# Patient Record
Sex: Female | Born: 1990 | Race: White | Hispanic: No | Marital: Married | Smoking: Former smoker
Health system: Southern US, Community
[De-identification: ages and names within clinical notes are randomized; demographics above are authoritative.]

## PROBLEM LIST (undated history)

## (undated) DIAGNOSIS — N926 Irregular menstruation, unspecified: Secondary | ICD-10-CM

## (undated) DIAGNOSIS — J45909 Unspecified asthma, uncomplicated: Secondary | ICD-10-CM

## (undated) DIAGNOSIS — N941 Unspecified dyspareunia: Secondary | ICD-10-CM

## (undated) DIAGNOSIS — A749 Chlamydial infection, unspecified: Secondary | ICD-10-CM

## (undated) DIAGNOSIS — J302 Other seasonal allergic rhinitis: Secondary | ICD-10-CM

## (undated) DIAGNOSIS — Z8759 Personal history of other complications of pregnancy, childbirth and the puerperium: Secondary | ICD-10-CM

## (undated) DIAGNOSIS — O99345 Other mental disorders complicating the puerperium: Secondary | ICD-10-CM

## (undated) DIAGNOSIS — F53 Postpartum depression: Secondary | ICD-10-CM

## (undated) DIAGNOSIS — G43909 Migraine, unspecified, not intractable, without status migrainosus: Secondary | ICD-10-CM

## (undated) HISTORY — DX: Personal history of other complications of pregnancy, childbirth and the puerperium: Z87.59

## (undated) HISTORY — DX: Unspecified dyspareunia: N94.10

## (undated) HISTORY — DX: Irregular menstruation, unspecified: N92.6

## (undated) HISTORY — DX: Other seasonal allergic rhinitis: J30.2

## (undated) HISTORY — DX: Postpartum depression: F53.0

## (undated) HISTORY — DX: Migraine, unspecified, not intractable, without status migrainosus: G43.909

## (undated) HISTORY — DX: Unspecified asthma, uncomplicated: J45.909

## (undated) HISTORY — DX: Other mental disorders complicating the puerperium: O99.345

## (undated) HISTORY — PX: IUD REMOVAL: SHX5392

## (undated) HISTORY — DX: Chlamydial infection, unspecified: A74.9

---
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
## 2010-06-02 HISTORY — PX: INTRAUTERINE DEVICE (IUD) INSERTION: SHX5877

## 2013-04-26 ENCOUNTER — Inpatient Hospital Stay: Payer: Self-pay | Admitting: Obstetrics & Gynecology

## 2013-04-26 LAB — CBC WITH DIFFERENTIAL/PLATELET
Basophil #: 0.1 10*3/uL (ref 0.0–0.1)
Basophil %: 0.5 %
Eosinophil #: 0.1 10*3/uL (ref 0.0–0.7)
Lymphocyte #: 1.5 10*3/uL (ref 1.0–3.6)
Lymphocyte %: 9.9 %
MCH: 29.3 pg (ref 26.0–34.0)
MCV: 85 fL (ref 80–100)
Neutrophil %: 82.5 %
Platelet: 171 10*3/uL (ref 150–440)
RBC: 4.19 10*6/uL (ref 3.80–5.20)
RDW: 12.8 % (ref 11.5–14.5)

## 2013-04-27 LAB — GC/CHLAMYDIA PROBE AMP

## 2013-04-27 LAB — HEMATOCRIT: HCT: 32.5 % — ABNORMAL LOW (ref 35.0–47.0)

## 2014-03-07 ENCOUNTER — Emergency Department: Payer: Self-pay | Admitting: Emergency Medicine

## 2014-03-07 LAB — BASIC METABOLIC PANEL
Anion Gap: 4 — ABNORMAL LOW (ref 7–16)
BUN: 14 mg/dL (ref 7–18)
CHLORIDE: 105 mmol/L (ref 98–107)
CO2: 29 mmol/L (ref 21–32)
Calcium, Total: 8.9 mg/dL (ref 8.5–10.1)
Creatinine: 0.67 mg/dL (ref 0.60–1.30)
EGFR (Non-African Amer.): >60
GLUCOSE: 93 mg/dL (ref 65–99)
OSMOLALITY: 276 (ref 275–301)
Potassium: 4 mmol/L (ref 3.5–5.1)
Sodium: 138 mmol/L (ref 136–145)

## 2014-03-07 LAB — HEPATIC FUNCTION PANEL A (ARMC)
ALBUMIN: 4.3 g/dL (ref 3.4–5.0)
Alkaline Phosphatase: 70 U/L
Bilirubin,Total: 0.2 mg/dL (ref 0.2–1.0)
SGOT(AST): 23 U/L (ref 15–37)
SGPT (ALT): 16 U/L (ref 12–78)
TOTAL PROTEIN: 7.4 g/dL (ref 6.4–8.2)

## 2014-03-07 LAB — CBC
HCT: 40.3 % (ref 35.0–47.0)
HGB: 13.9 g/dL (ref 12.0–16.0)
MCH: 30.1 pg (ref 26.0–34.0)
MCHC: 34.5 g/dL (ref 32.0–36.0)
MCV: 87 fL (ref 80–100)
PLATELETS: 288 10*3/uL (ref 150–440)
RBC: 4.62 10*6/uL (ref 3.80–5.20)
RDW: 13.4 % (ref 11.5–14.5)
WBC: 6.8 10*3/uL (ref 3.6–11.0)

## 2014-03-07 LAB — LIPASE, BLOOD: LIPASE: 197 U/L (ref 73–393)

## 2015-03-26 NOTE — H&P (Signed)
L&D Evaluation:  History:  HPI 24 yo G2P1001 at 633w4d by D=7wk US derived EDC of 05/13/13 presenting with contractions.  The patient was 2cm at 36 weeks and at her prenatal visit today was noted to be 3/70/-1.  No LOF, no VB.  [facility] for echogenic bowl noted at 20 week anatomy US with resolution on follow up US at 25 weeks.  First trimester screening was obtained with T21 risk of <1:10000 and T:18 risk of <1:10000.  CF carrier screening negative.  Remainder of PNL B pos / ABSC neg / RI / VZNI / HIV neg / HBsAg neg / RPR NR / GC & CT neg & neg / 1-hr OGTT 69 / GBS neg 04/18/13   Presents with contractions   Patient's Medical History Asthma  migraines   Patient's Surgical History none   Medications Pre Natal Vitamins   Allergies PCN, ASA   Social History none   Family History Non-Contributory   ROS:  ROS All systems were reviewed.  HEENT, CNS, GI, GU, Respiratory, CV, Renal and Musculoskeletal systems were found to be normal.   Exam:  Vital Signs stable   Urine Protein not completed   General no apparent distress   Mental Status clear   Abdomen gravid, tender with contractions   Estimated Fetal Weight Average for gestational age   Fetal Position vtx   Edema no edema   Pelvic no external lesions, 7cm per nursing staff   Mebranes Intact   FHT 120, moderate, + accels, no decels   Ucx regular   Impression:  Impression active labor   Plan:  Plan EFM/NST, monitor contractions and for cervical change   Electronic Signatures: Lorrene ReidStaebler, Dehlia Kilner M (MD)  (Signed 11-Jun-14 19:18)  Authored: L&D Evaluation   Last Updated: 11-Jun-14 19:18 by Lorrene ReidStaebler, Geddy Boydstun M (MD)

## 2016-01-23 LAB — HM PAP SMEAR

## 2016-09-18 LAB — OB RESULTS CONSOLE HEPATITIS B SURFACE ANTIGEN: Hepatitis B Surface Ag: NEGATIVE

## 2016-09-18 LAB — OB RESULTS CONSOLE HGB/HCT, BLOOD
HEMATOCRIT: 37 %
HEMOGLOBIN: 12.7 g/dL

## 2016-09-18 LAB — OB RESULTS CONSOLE HIV ANTIBODY (ROUTINE TESTING): HIV: NONREACTIVE

## 2016-09-18 LAB — OB RESULTS CONSOLE ABO/RH: RH Type: POSITIVE

## 2016-09-18 LAB — OB RESULTS CONSOLE GC/CHLAMYDIA
Chlamydia: NEGATIVE
Gonorrhea: NEGATIVE

## 2016-09-18 LAB — OB RESULTS CONSOLE RPR: RPR: NONREACTIVE

## 2016-09-18 LAB — OB RESULTS CONSOLE PLATELET COUNT: PLATELETS: 280 10*3/uL

## 2016-09-18 LAB — OB RESULTS CONSOLE RUBELLA ANTIBODY, IGM: RUBELLA: IMMUNE

## 2016-09-18 LAB — OB RESULTS CONSOLE ANTIBODY SCREEN: Antibody Screen: NEGATIVE

## 2016-09-18 LAB — OB RESULTS CONSOLE VARICELLA ZOSTER ANTIBODY, IGG: Varicella: NON-IMMUNE/NOT IMMUNE

## 2016-11-16 NOTE — L&D Delivery Note (Signed)
Date of delivery: 05/04/2017 Estimated Date of Delivery: 05/08/17 EGA: 6671w3d  Delivery Note At 7:59 PM a viable female was delivered via Vaginal, Spontaneous Delivery (Presentation: OA; ROA).  APGAR: 6, 8; weight 8 lb 7.5 oz (3840 g).   Placenta status: spontaneous, intact.  Cord:  with the following complications: none.  Cord pH: NA  Called to see patient for decision to AROM bulging bag of fluid. AROM for large amount of light meconium stained fluid. Following AROM small lip of anterior cervix and patient easily pushed past cervix following a short rest time. She pushed for 35 minutes to deliver a viable female infant.  The head followed by shoulders, which delivered without difficulty, and the rest of the body.  No nuchal cord noted.  Baby to mom's chest.  Cord clamped and cut after < 1 min delay and baby taken to warmer for evaluation and transition support.  No cord blood obtained.  Placenta delivered spontaneously, intact, with a 3-vessel cord.  Perineum intact.  All counts correct.  Hemostasis obtained with IV pitocin and fundal massage.   Anesthesia: none Episiotomy: none Lacerations: none  Suture Repair: NA Est. Blood Loss (mL): 250   Mom to postpartum.  Baby initially to special care nursery for transition.  Tresea MallJane Jett Kulzer, CNM 05/04/2017, 8:36 PM

## 2017-01-22 ENCOUNTER — Encounter: Payer: Self-pay | Admitting: Certified Nurse Midwife

## 2017-01-22 ENCOUNTER — Ambulatory Visit (INDEPENDENT_AMBULATORY_CARE_PROVIDER_SITE_OTHER): Payer: Self-pay | Admitting: Certified Nurse Midwife

## 2017-01-22 VITALS — BP 102/62 | HR 94 | Wt 158.0 lb

## 2017-01-22 DIAGNOSIS — Z139 Encounter for screening, unspecified: Secondary | ICD-10-CM

## 2017-01-22 DIAGNOSIS — Z131 Encounter for screening for diabetes mellitus: Secondary | ICD-10-CM

## 2017-01-22 DIAGNOSIS — F329 Major depressive disorder, single episode, unspecified: Secondary | ICD-10-CM

## 2017-01-22 DIAGNOSIS — Z3A24 24 weeks gestation of pregnancy: Secondary | ICD-10-CM

## 2017-01-22 DIAGNOSIS — O0990 Supervision of high risk pregnancy, unspecified, unspecified trimester: Secondary | ICD-10-CM | POA: Insufficient documentation

## 2017-01-22 DIAGNOSIS — O99342 Other mental disorders complicating pregnancy, second trimester: Secondary | ICD-10-CM

## 2017-01-22 DIAGNOSIS — Z113 Encounter for screening for infections with a predominantly sexual mode of transmission: Secondary | ICD-10-CM

## 2017-01-22 DIAGNOSIS — F32A Depression, unspecified: Secondary | ICD-10-CM | POA: Insufficient documentation

## 2017-01-22 DIAGNOSIS — O0992 Supervision of high risk pregnancy, unspecified, second trimester: Secondary | ICD-10-CM

## 2017-01-22 MED ORDER — SERTRALINE HCL 50 MG PO TABS: ORAL_TABLET | ORAL | 2 refills | Status: DC

## 2017-01-22 NOTE — Progress Notes (Signed)
Complains of some sacral/left SI joint back pain. Has had intermittently since last delivery, returning as pregnancy progresses. DIscussed using back support at work and using good Estate manager/land agentbody mechanics. Can use Biofreeze/ ice or heat to the area. Feels that Zoloft has helped, but thinks needs higher dose. Will increase dose from 50 mgm to 75 mgm. Wants to breast feed. Info on sibling classes given. Interested in N2O2 for pain relief in labor. Wants to avoid epidural.28 week labs and ROB in 3-4 weeks.

## 2017-01-22 NOTE — Progress Notes (Signed)
Pt c/o recent nose bleeds

## 2017-02-19 ENCOUNTER — Other Ambulatory Visit: Payer: Self-pay

## 2017-02-19 ENCOUNTER — Encounter: Payer: Self-pay | Admitting: Certified Nurse Midwife

## 2017-02-22 NOTE — Addendum Note (Signed)
Addended by: Kathlene Cote on: 02/22/2017 09:18 AM   Modules accepted: Orders

## 2017-02-23 ENCOUNTER — Other Ambulatory Visit: Payer: 59

## 2017-02-23 ENCOUNTER — Telehealth: Payer: Self-pay

## 2017-02-23 ENCOUNTER — Ambulatory Visit (INDEPENDENT_AMBULATORY_CARE_PROVIDER_SITE_OTHER): Payer: 59 | Admitting: Obstetrics & Gynecology

## 2017-02-23 VITALS — BP 100/60 | HR 111 | Wt 169.0 lb

## 2017-02-23 DIAGNOSIS — O0990 Supervision of high risk pregnancy, unspecified, unspecified trimester: Secondary | ICD-10-CM

## 2017-02-23 DIAGNOSIS — Z3A29 29 weeks gestation of pregnancy: Secondary | ICD-10-CM

## 2017-02-23 DIAGNOSIS — Z113 Encounter for screening for infections with a predominantly sexual mode of transmission: Secondary | ICD-10-CM

## 2017-02-23 DIAGNOSIS — Z131 Encounter for screening for diabetes mellitus: Secondary | ICD-10-CM

## 2017-02-23 DIAGNOSIS — O0992 Supervision of high risk pregnancy, unspecified, second trimester: Secondary | ICD-10-CM

## 2017-02-23 NOTE — Telephone Encounter (Signed)
FMLA/DISABILITY form for [employer] filled out and given to [place] for processing. 

## 2017-02-23 NOTE — Progress Notes (Signed)
No Complaints. Labs today.  Breast feeding.  Vasec soon by husband. Blood type 11/3 - B+, AbSc neg, RI, VI, HBsAG neg, HIV neg, RPR neg, Urine Cx <10k, GC/CT neg  Dating criteria - 8 wk u/s EDD adjusted  Desires genetic screening - 12/14 normal XX; MSAFP neg  Hx of quick labors -  Depression - restarted Zoloft  12/14

## 2017-02-24 LAB — 28 WEEK RH+PANEL
Basophils Absolute: 0 10*3/uL (ref 0.0–0.2)
Basos: 0 %
EOS (ABSOLUTE): 0.1 10*3/uL (ref 0.0–0.4)
Eos: 1 %
GESTATIONAL DIABETES SCREEN: 116 mg/dL (ref 65–139)
HEMATOCRIT: 30.8 % — AB (ref 34.0–46.6)
HEMOGLOBIN: 10.6 g/dL — AB (ref 11.1–15.9)
HIV Screen 4th Generation wRfx: NONREACTIVE
Immature Grans (Abs): 0.2 10*3/uL — ABNORMAL HIGH (ref 0.0–0.1)
Immature Granulocytes: 1 %
LYMPHS ABS: 1.5 10*3/uL (ref 0.7–3.1)
LYMPHS: 12 %
MCH: 30.7 pg (ref 26.6–33.0)
MCHC: 34.4 g/dL (ref 31.5–35.7)
MCV: 89 fL (ref 79–97)
MONOS ABS: 0.6 10*3/uL (ref 0.1–0.9)
Monocytes: 5 %
NEUTROS ABS: 10.3 10*3/uL — AB (ref 1.4–7.0)
Neutrophils: 81 %
Platelets: 188 10*3/uL (ref 150–379)
RBC: 3.45 x10E6/uL — AB (ref 3.77–5.28)
RDW: 13.8 % (ref 12.3–15.4)
RPR: NONREACTIVE
WBC: 12.4 10*3/uL — ABNORMAL HIGH (ref 3.4–10.8)

## 2017-03-09 ENCOUNTER — Ambulatory Visit (INDEPENDENT_AMBULATORY_CARE_PROVIDER_SITE_OTHER): Payer: 59 | Admitting: Advanced Practice Midwife

## 2017-03-09 VITALS — BP 102/64 | Wt 170.0 lb

## 2017-03-09 DIAGNOSIS — Z3A31 31 weeks gestation of pregnancy: Secondary | ICD-10-CM

## 2017-03-09 NOTE — Progress Notes (Signed)
Doing well. Feels pressure in pelvis. Has BH ctx's and occasional back pain. Reviewed FKCs and labor precautions. Zoloft is working well.

## 2017-03-23 ENCOUNTER — Encounter: Payer: 59 | Admitting: Obstetrics and Gynecology

## 2017-03-23 ENCOUNTER — Ambulatory Visit (INDEPENDENT_AMBULATORY_CARE_PROVIDER_SITE_OTHER): Payer: 59 | Admitting: Obstetrics and Gynecology

## 2017-03-23 VITALS — BP 108/68 | Wt 170.0 lb

## 2017-03-23 DIAGNOSIS — Z3A33 33 weeks gestation of pregnancy: Secondary | ICD-10-CM

## 2017-03-23 NOTE — Progress Notes (Signed)
Pos PNVs. Will add Fe. No VB, LOF. Suprapubic pressure, pos BH contrxns. Cx long, FT at ext os, closed int os. RTO 2 wks. Zoloft working well.

## 2017-03-30 NOTE — Telephone Encounter (Signed)
This encounter was created in error - please disregard.

## 2017-04-08 ENCOUNTER — Ambulatory Visit (INDEPENDENT_AMBULATORY_CARE_PROVIDER_SITE_OTHER): Payer: 59 | Admitting: Advanced Practice Midwife

## 2017-04-08 VITALS — BP 112/72 | Wt 180.0 lb

## 2017-04-08 DIAGNOSIS — Z3A35 35 weeks gestation of pregnancy: Secondary | ICD-10-CM

## 2017-04-08 NOTE — Progress Notes (Signed)
Baby 6.5-7 pounds EFW on leopolds today. Good fetal movement. Still having back pain. Recommended abdominal support, heat, stretching, bath. Encouraged increased hydration for swelling and headaches. Taking Fe supplement. GBS nv.

## 2017-04-08 NOTE — Progress Notes (Signed)
Feet/hands swelling/headaches Steady rib pain under left breast

## 2017-04-15 ENCOUNTER — Encounter: Payer: 59 | Admitting: Obstetrics & Gynecology

## 2017-04-16 ENCOUNTER — Ambulatory Visit (INDEPENDENT_AMBULATORY_CARE_PROVIDER_SITE_OTHER): Payer: 59 | Admitting: Obstetrics & Gynecology

## 2017-04-16 VITALS — BP 100/70 | Wt 182.0 lb

## 2017-04-16 DIAGNOSIS — Z3A36 36 weeks gestation of pregnancy: Secondary | ICD-10-CM

## 2017-04-16 DIAGNOSIS — O0990 Supervision of high risk pregnancy, unspecified, unspecified trimester: Secondary | ICD-10-CM

## 2017-04-16 NOTE — Progress Notes (Signed)
PNV, FMC, PTL precautions, GBS and Aptima done, Vasec already done, Breast feeding

## 2017-04-16 NOTE — Patient Instructions (Signed)
TESTING TODAY FOR:    Group B Streptococcus Infection During Pregnancy Group B Streptococcus (GBS) is a type of bacteria (Streptococcus agalactiae) that is often found in healthy people, commonly in the rectum, vagina, and intestines. In people who are healthy and not pregnant, the bacteria rarely cause serious illness or complications. However, women who test positive for GBS during pregnancy can pass the bacteria to their baby during childbirth, which can cause serious infection in the baby after birth. Women with GBS may also have infections during their pregnancy or immediately after childbirth, such as such as urinary tract infections (UTIs) or infections of the uterus (uterine infections). Having GBS also increases a woman's risk of complications during pregnancy, such as early (preterm) labor or delivery, miscarriage, or stillbirth. Routine testing (screening) for GBS is recommended for all pregnant women. What increases the risk? You may have a higher risk for GBS infection during pregnancy if you had one during a past pregnancy. What are the signs or symptoms? In most cases, GBS infection does not cause symptoms in pregnant women. Signs and symptoms of a possible GBS-related infection may include:  Labor starting before the 37th week of pregnancy.  A UTI or bladder infection, which may cause: ? Fever. ? Pain or burning during urination. ? Frequent urination.  Fever during labor, along with: ? Bad-smelling discharge. ? Uterine tenderness. ? Rapid heartbeat in the mother, baby, or both.  Rare but serious symptoms of a possible GBS-related infection in women include:  Blood infection (septicemia). This may cause fever, chills, or confusion.  Lung infection (pneumonia). This may cause fever, chills, cough, rapid breathing, difficulty breathing, or chest pain.  Bone, joint, skin, or soft tissue infection.  How is this diagnosed? You may be screened for GBS between week 35 and week  37 of your pregnancy. If you have symptoms of preterm labor, you may be screened earlier. This condition is diagnosed based on lab test results from:  A swab of fluid from the vagina and rectum.  A urine sample.  How is this treated? This condition is treated with antibiotic medicine. When you go into labor, or as soon as your water breaks (your membranes rupture), you will be given antibiotics through an IV tube. Antibiotics will continue until after you give birth. If you are having a cesarean delivery, you do not need antibiotics unless your membranes have already ruptured. Follow these instructions at home:  Take over-the-counter and prescription medicines only as told by your health care provider.  Take your antibiotic medicine as told by your health care provider. Do not stop taking the antibiotic even if you start to feel better.  Keep all pre-birth (prenatal) visits and follow-up visits as told by your health care provider. This is important. Contact a health care provider if:  You have pain or burning when you urinate.  You have to urinate frequently.  You have a fever or chills.  You develop a bad-smelling vaginal discharge. Get help right away if:  Your membranes rupture.  You go into labor.  You have severe pain in your abdomen.  You have difficulty breathing.  You have chest pain. This information is not intended to replace advice given to you by your health care provider. Make sure you discuss any questions you have with your health care provider. Document Released: 02/09/2008 Document Revised: 05/29/2016 Document Reviewed: 05/28/2016 Elsevier Interactive Patient Education  [employer]2018 Elsevier Inc.

## 2017-04-18 LAB — GC/CHLAMYDIA PROBE AMP
Chlamydia trachomatis, NAA: NEGATIVE
NEISSERIA GONORRHOEAE BY PCR: NEGATIVE

## 2017-04-20 LAB — CULTURE, BETA STREP (GROUP B ONLY): STREP GP B CULTURE: NEGATIVE

## 2017-04-23 ENCOUNTER — Ambulatory Visit (INDEPENDENT_AMBULATORY_CARE_PROVIDER_SITE_OTHER): Payer: 59 | Admitting: Obstetrics and Gynecology

## 2017-04-23 VITALS — BP 110/68 | Wt 183.0 lb

## 2017-04-23 DIAGNOSIS — O99343 Other mental disorders complicating pregnancy, third trimester: Secondary | ICD-10-CM

## 2017-04-23 DIAGNOSIS — F32A Depression, unspecified: Secondary | ICD-10-CM

## 2017-04-23 DIAGNOSIS — O0990 Supervision of high risk pregnancy, unspecified, unspecified trimester: Secondary | ICD-10-CM

## 2017-04-23 DIAGNOSIS — F329 Major depressive disorder, single episode, unspecified: Secondary | ICD-10-CM

## 2017-04-23 DIAGNOSIS — Z3A37 37 weeks gestation of pregnancy: Secondary | ICD-10-CM

## 2017-04-23 NOTE — Progress Notes (Signed)
No vb. No lof.  Very strict labor precautions given

## 2017-04-30 ENCOUNTER — Ambulatory Visit (INDEPENDENT_AMBULATORY_CARE_PROVIDER_SITE_OTHER): Payer: 59 | Admitting: Certified Nurse Midwife

## 2017-04-30 VITALS — BP 108/68 | Wt 186.0 lb

## 2017-04-30 DIAGNOSIS — Z3A38 38 weeks gestation of pregnancy: Secondary | ICD-10-CM | POA: Diagnosis not present

## 2017-04-30 DIAGNOSIS — O0990 Supervision of high risk pregnancy, unspecified, unspecified trimester: Secondary | ICD-10-CM

## 2017-04-30 DIAGNOSIS — O368130 Decreased fetal movements, third trimester, not applicable or unspecified: Secondary | ICD-10-CM | POA: Diagnosis not present

## 2017-04-30 NOTE — Progress Notes (Signed)
Pt c/o swelling in feet, ankles and legs. Fingers swelling in the morning. Pelvic pain and pressure. Intermittent ctx. Lost mucous plug.

## 2017-05-02 NOTE — Addendum Note (Signed)
Addended by: Farrel ConnersGUTIERREZ, Yaviel Kloster on: 05/02/2017 05:14 PM   Modules accepted: Orders, SmartSet

## 2017-05-02 NOTE — H&P (Signed)
OB History & Physical   History of Present Illness:  Chief Complaint:  Presents for an induction of labor HPI:  Sara Harrison is a 26 y.o. G42P2002 female with EDC=05/08/2017 at [redacted]w[redacted]d dated by an 8 week ultrasound.  Her pregnancy has been complicated by asthma, depression (currently on Zoloft), anemia, gestational edema,  and a history of fast labors.  She presents to L&D for an induction of labor due to advanced cervical dilatation and a history of fast labors. She has been having painful, irregular contractions. No frank bleeding or leakage of water. Had some decreased fetal movement on 6/15, but NST was nicely reactive.   Prenatal care site: Prenatal care at [facility]  Dating [place]  Genetic Screen   NIPS: Normal XX  Anatomic [place] WS  GTT                Third trimester: 116  Rhogam na  TDaP vaccine           no            Flu Shot:  Baby Food         Breast                        Contraception Vasec  CBB    CS/VBAC   Support Person             Prenatal Labs  Blood type: B/Positive/-- (11/03 0000)   Antibody:Negative (11/03 0000)  Rubella: Immune (11/03 0000) Varicella: I  RPR: Nonreactive (11/03 0000)   HBsAg: Negative (11/03 0000)   HIV: Non-reactive (11/03 0000)   GBS: negative 04/16/2017  Pap:NIL 01/23/16  GC/Chlamydia: neg/neg 04/16/2017           Maternal Medical History:   Past Medical History:  Diagnosis Date  . Asthma   . Chlamydia   . Dyspareunia, female   . History of precipitous delivery   . Irregular menses   . Migraine   . Postpartum depression   . Seasonal allergies     Past Surgical History:  Procedure Laterality Date  . INTRAUTERINE DEVICE (IUD) INSERTION  06/02/2010  . IUD REMOVAL      Allergies  Allergen Reactions  . Penicillins Hives  . Aspirin Other (See Comments)    Felt dizzy, like she was going to pass out    Prior to Admission medications   Medication Sig Start Date End Date Taking? Authorizing Provider   ferrous sulfate 325 (65 FE) MG EC tablet Take 325 mg by mouth 3 (three) times daily with meals.   Yes [provider]  Prenatal Vit-Fe Fumarate-FA (PRENATAL MULTIVITAMIN) TABS tablet Take 1 tablet by mouth daily at 12 noon.   Yes [provider]  sertraline (ZOLOFT) 50 MG tablet Take 1.5 tablets daily 01/22/17  Yes Farrel Conners, CNM          Social History: She  reports that she has quit smoking. She has never used smokeless tobacco. She reports that she does not use drugs.  Family History: family history includes Heart disease in her maternal grandmother; Heart failure in her paternal grandfather; Hypertension in her maternal grandfather; Thyroid disease in her paternal grandfather.   Review of Systems: Negative x 10 systems reviewed except as noted in the HPI.      Physical Exam:  Vital Signs: BP 108/68   Wt 186 lb (84.4 kg)   LMP 06/29/2016   BMI 31.93 kg/m  General: gravid WF no acute distress.  HEENT: normocephalic, atraumatic Neck: no nodules or thyroidmegaly Heart: regular rate & rhythm.  No murmurs/rubs/gallops Lungs: clear to auscultation bilaterally Abdomen: soft, gravid, non-tender;  EFW: 8#. FHTs 145 Pelvic:   External: Normal external female genitalia  Cervix: Dilation: 4 / Effacement (%): 60 / Station: -1   Extremities: non-tender, symmetric, +3 edema bilaterally.   Neurologic: Alert & oriented x 3.        Assessment:  Sara Harrison is a 26 y.o. 613P2002 female at 4810w3d for IOL for history of fast labors and advanced cervical dilation Bishop score=8   Plan:  1. Admit to Labor & Delivery   2. CBC, T&S, Clrs, IVF 3. GBS negative.   4. Consents obtained. 5.  Di.scussed induction process, risks of hyperstimulation, fetal intolerance, and Cesarean section. Plan Pitocin induction. 6. N2O2 for pain relief/ Epidural if desires  Farrel ConnersColleen Kenly Henckel  05/02/2017 4:45 PM

## 2017-05-02 NOTE — Progress Notes (Signed)
Very uncomfortable. Reports decreased FM today NST reactive with baseline 145 and accelerations to 180, moderate variability Cervix: 4-4.5/60%/ -1/ vertex History of fast labor with last pregnancy GBS negative Discussed induction with Dr Tiburcio PeaHarris for advanced cervical dilation and hx of fast labor: Recommended scheduling IOL Scheduled for 6/19 at 0800-probable Pitocin and AROM. Labor precautions [facility] instructions

## 2017-05-04 ENCOUNTER — Inpatient Hospital Stay
Admission: EM | Admit: 2017-05-04 | Discharge: 2017-05-06 | DRG: 775 | Disposition: A | Discharge status: Home / Self Care | Payer: 59 | Attending: Advanced Practice Midwife | Admitting: Advanced Practice Midwife

## 2017-05-04 DIAGNOSIS — O99344 Other mental disorders complicating childbirth: Secondary | ICD-10-CM | POA: Diagnosis present

## 2017-05-04 DIAGNOSIS — F329 Major depressive disorder, single episode, unspecified: Secondary | ICD-10-CM | POA: Diagnosis present

## 2017-05-04 DIAGNOSIS — Z3A39 39 weeks gestation of pregnancy: Secondary | ICD-10-CM

## 2017-05-04 DIAGNOSIS — O26893 Other specified pregnancy related conditions, third trimester: Secondary | ICD-10-CM | POA: Diagnosis present

## 2017-05-04 DIAGNOSIS — O1204 Gestational edema, complicating childbirth: Secondary | ICD-10-CM | POA: Diagnosis present

## 2017-05-04 DIAGNOSIS — D649 Anemia, unspecified: Secondary | ICD-10-CM | POA: Diagnosis not present

## 2017-05-04 DIAGNOSIS — O9952 Diseases of the respiratory system complicating childbirth: Secondary | ICD-10-CM | POA: Diagnosis present

## 2017-05-04 DIAGNOSIS — J45909 Unspecified asthma, uncomplicated: Secondary | ICD-10-CM | POA: Diagnosis present

## 2017-05-04 DIAGNOSIS — O9902 Anemia complicating childbirth: Secondary | ICD-10-CM | POA: Diagnosis present

## 2017-05-04 LAB — CHLAMYDIA/NGC RT PCR (ARMC ONLY)
Chlamydia Tr: NOT DETECTED
N GONORRHOEAE: NOT DETECTED

## 2017-05-04 LAB — COMPREHENSIVE METABOLIC PANEL
ALK PHOS: 122 U/L (ref 38–126)
ALT: 8 U/L — AB (ref 14–54)
AST: 31 U/L (ref 15–41)
Albumin: 2.9 g/dL — ABNORMAL LOW (ref 3.5–5.0)
Anion gap: 11 (ref 5–15)
BILIRUBIN TOTAL: 0.6 mg/dL (ref 0.3–1.2)
BUN: 7 mg/dL (ref 6–20)
CALCIUM: 9.2 mg/dL (ref 8.9–10.3)
CHLORIDE: 106 mmol/L (ref 101–111)
CO2: 21 mmol/L — ABNORMAL LOW (ref 22–32)
CREATININE: 0.51 mg/dL (ref 0.44–1.00)
Glucose, Bld: 91 mg/dL (ref 65–99)
Potassium: 3.6 mmol/L (ref 3.5–5.1)
Sodium: 138 mmol/L (ref 135–145)
TOTAL PROTEIN: 6.5 g/dL (ref 6.5–8.1)

## 2017-05-04 LAB — CBC
HCT: 40 % (ref 35.0–47.0)
Hemoglobin: 13.7 g/dL (ref 12.0–16.0)
MCH: 30.8 pg (ref 26.0–34.0)
MCHC: 34.4 g/dL (ref 32.0–36.0)
MCV: 89.6 fL (ref 80.0–100.0)
PLATELETS: 162 10*3/uL (ref 150–440)
RBC: 4.47 MIL/uL (ref 3.80–5.20)
RDW: 14.9 % — AB (ref 11.5–14.5)
WBC: 11.2 10*3/uL — AB (ref 3.6–11.0)

## 2017-05-04 LAB — TYPE AND SCREEN
ABO/RH(D): B POS
Antibody Screen: NEGATIVE

## 2017-05-04 LAB — PROTEIN / CREATININE RATIO, URINE
CREATININE, URINE: 152 mg/dL
Protein Creatinine Ratio: 0.16 mg/mg{Cre} — ABNORMAL HIGH (ref 0.00–0.15)
TOTAL PROTEIN, URINE: 24 mg/dL

## 2017-05-04 MED ORDER — LACTATED RINGERS IV SOLN
INTRAVENOUS | Status: DC
  Administered 2017-05-04 (×2): via INTRAVENOUS

## 2017-05-04 MED ORDER — COCONUT OIL OIL
1.0000 "application " | TOPICAL_OIL | Status: DC | PRN
  Administered 2017-05-05: 1 via TOPICAL
  Filled 2017-05-04: qty 120

## 2017-05-04 MED ORDER — TERBUTALINE SULFATE 1 MG/ML IJ SOLN: 0.2500 mg | Freq: Once | INTRAMUSCULAR | Status: DC | PRN

## 2017-05-04 MED ORDER — DIBUCAINE 1 % RE OINT: 1.0000 "application " | TOPICAL_OINTMENT | RECTAL | Status: DC | PRN

## 2017-05-04 MED ORDER — SENNOSIDES-DOCUSATE SODIUM 8.6-50 MG PO TABS
2.0000 | ORAL_TABLET | ORAL | Status: DC
  Administered 2017-05-05 – 2017-05-06 (×2): 2 via ORAL
  Filled 2017-05-04 (×2): qty 2

## 2017-05-04 MED ORDER — OXYTOCIN 40 UNITS IN LACTATED RINGERS INFUSION - SIMPLE MED
INTRAVENOUS | Status: AC
  Administered 2017-05-04: 500 mL via INTRAVENOUS
  Filled 2017-05-04: qty 1000

## 2017-05-04 MED ORDER — LACTATED RINGERS IV SOLN: 500.0000 mL | INTRAVENOUS | Status: DC | PRN

## 2017-05-04 MED ORDER — AZITHROMYCIN 250 MG PO TABS: 1000.0000 mg | ORAL_TABLET | Freq: Once | ORAL | Status: DC

## 2017-05-04 MED ORDER — AMMONIA AROMATIC IN INHA: 0.3000 mL | Freq: Once | RESPIRATORY_TRACT | Status: DC | PRN

## 2017-05-04 MED ORDER — AMMONIA AROMATIC IN INHA
RESPIRATORY_TRACT | Status: DC
Start: 2017-05-04 — End: 2017-05-04
  Filled 2017-05-04: qty 10

## 2017-05-04 MED ORDER — MISOPROSTOL 200 MCG PO TABS: 800.0000 ug | ORAL_TABLET | Freq: Once | ORAL | Status: DC | PRN

## 2017-05-04 MED ORDER — OXYTOCIN 10 UNIT/ML IJ SOLN
INTRAMUSCULAR | Status: AC
  Filled 2017-05-04: qty 2

## 2017-05-04 MED ORDER — WITCH HAZEL-GLYCERIN EX PADS: 1.0000 "application " | MEDICATED_PAD | CUTANEOUS | Status: DC | PRN

## 2017-05-04 MED ORDER — OXYTOCIN BOLUS FROM INFUSION
500.0000 mL | Freq: Once | INTRAVENOUS | Status: AC
Start: 2017-05-04 — End: 2017-05-04
  Administered 2017-05-04: 500 mL via INTRAVENOUS

## 2017-05-04 MED ORDER — MISOPROSTOL 200 MCG PO TABS
ORAL_TABLET | ORAL | Status: AC
  Filled 2017-05-04: qty 4

## 2017-05-04 MED ORDER — ONDANSETRON HCL 4 MG/2ML IJ SOLN: 4.0000 mg | Freq: Four times a day (QID) | INTRAMUSCULAR | Status: DC | PRN

## 2017-05-04 MED ORDER — SERTRALINE HCL 25 MG PO TABS
75.0000 mg | ORAL_TABLET | Freq: Every day | ORAL | Status: DC
  Administered 2017-05-05 – 2017-05-06 (×2): 75 mg via ORAL
  Filled 2017-05-04 (×2): qty 3

## 2017-05-04 MED ORDER — SIMETHICONE 80 MG PO CHEW: 80.0000 mg | CHEWABLE_TABLET | ORAL | Status: DC | PRN

## 2017-05-04 MED ORDER — OXYTOCIN 40 UNITS IN LACTATED RINGERS INFUSION - SIMPLE MED: 2.5000 [IU]/h | INTRAVENOUS | Status: DC

## 2017-05-04 MED ORDER — PRENATAL MULTIVITAMIN CH
1.0000 | ORAL_TABLET | Freq: Every day | ORAL | Status: DC
  Administered 2017-05-05 – 2017-05-06 (×2): 1 via ORAL
  Filled 2017-05-04 (×2): qty 1

## 2017-05-04 MED ORDER — OXYTOCIN 40 UNITS IN LACTATED RINGERS INFUSION - SIMPLE MED
1.0000 m[IU]/min | INTRAVENOUS | Status: DC
  Administered 2017-05-04: 2 m[IU]/min via INTRAVENOUS

## 2017-05-04 MED ORDER — ACETAMINOPHEN 325 MG PO TABS
650.0000 mg | ORAL_TABLET | ORAL | Status: DC | PRN
Start: 2017-05-04 — End: 2017-05-06
  Administered 2017-05-05 (×3): 650 mg via ORAL
  Filled 2017-05-04 (×3): qty 2

## 2017-05-04 MED ORDER — CALCIUM CARBONATE ANTACID 500 MG PO CHEW
1.0000 | CHEWABLE_TABLET | Freq: Three times a day (TID) | ORAL | Status: DC | PRN
  Administered 2017-05-04 (×2): 200 mg via ORAL
  Filled 2017-05-04 (×2): qty 1

## 2017-05-04 MED ORDER — LIDOCAINE HCL (PF) 1 % IJ SOLN
30.0000 mL | INTRAMUSCULAR | Status: DC | PRN
  Filled 2017-05-04: qty 30

## 2017-05-04 MED ORDER — OXYCODONE HCL 5 MG PO TABS: 5.0000 mg | ORAL_TABLET | ORAL | Status: DC | PRN

## 2017-05-04 MED ORDER — OXYCODONE HCL 5 MG PO TABS: 10.0000 mg | ORAL_TABLET | ORAL | Status: DC | PRN

## 2017-05-04 MED ORDER — BENZOCAINE-MENTHOL 20-0.5 % EX AERO
1.0000 "application " | INHALATION_SPRAY | CUTANEOUS | Status: DC | PRN
  Administered 2017-05-05: 1 via TOPICAL
  Filled 2017-05-04: qty 56

## 2017-05-04 MED ORDER — DIPHENHYDRAMINE HCL 25 MG PO CAPS: 25.0000 mg | ORAL_CAPSULE | Freq: Four times a day (QID) | ORAL | Status: DC | PRN

## 2017-05-04 MED ORDER — ONDANSETRON HCL 4 MG PO TABS: 4.0000 mg | ORAL_TABLET | ORAL | Status: DC | PRN

## 2017-05-04 MED ORDER — TETANUS-DIPHTH-ACELL PERTUSSIS 5-2.5-18.5 LF-MCG/0.5 IM SUSP: 0.5000 mL | Freq: Once | INTRAMUSCULAR | Status: DC

## 2017-05-04 MED ORDER — LIDOCAINE HCL (PF) 1 % IJ SOLN
INTRAMUSCULAR | Status: AC
  Filled 2017-05-04: qty 30

## 2017-05-04 MED ORDER — ONDANSETRON HCL 4 MG/2ML IJ SOLN: 4.0000 mg | INTRAMUSCULAR | Status: DC | PRN

## 2017-05-04 NOTE — H&P (Signed)
Date of Initial H&P:04/30/17  History reviewed, patient examined, and changes noted below:  26 year old G3 35P2002 with EDC=05/08/2017 at 39wk 3days who presents for an induction of labor for advanced cervical dilation and history of fast labors.  Her pregnancy has been complicated by asthma, depression (currently on Zoloft), anemia, gestational edema, and a history of fast labors. Has been having some bouts of regular contractions. No vaginal bleeding. Has had some mucoid discharge. Denies headaches, visual changes, scotomata, and chest pain. ROS also positive for sacral back pain since the delivery of her last child.  B POS/ RI/ VI/ GBS negative  Breast/ vasectomy  Exam:  Patient Vitals for the past 24 hrs:  BP Pulse  05/04/17 0824 135/90 (!) 101  05/04/17 0814 136/89 (!) 107   FHR 150 with accelerations to 170s to 180, moderate variability Toco: contractions q 3-8 min apart Cervix: 5/80-90%/-1 to 0  A: IUP at 39.3 weeks for IOL for advanced cervical dilation and hx of fast labors Bishop score=10 Mildly elevated blood pressures  P: Admit to L&D Consents Pitocin induction followed by AROM PIH labs/ monitor blood pressures closely GBS negative- no PPX needed TDAP-did not receive AP-offer postpartum if indicated B POS/ RI/ VI N2O2 or Stadol or epidural for pain as desires  Farrel Connersolleen Trish Mancinelli, CNM

## 2017-05-04 NOTE — Discharge Summary (Signed)
OB Discharge Summary     Patient Name: Sara Harrison DOB: [birth year] MRN: [id number]  Date of admission: 05/04/2017 Delivering provider: Tresea Mall, CNM  Date of Delivery: 05/04/2017  Date of discharge: 05/06/2017   Admitting diagnosis: 39 weeks Induction of labor for history of fast labor and advanced cervical dilation Intrauterine pregnancy: [redacted]w[redacted]d     Secondary diagnosis: depression     Discharge diagnosis: Term Pregnancy Delivered                                                                                                Post partum procedures: none  Augmentation: AROM and Pitocin  Complications: None  Hospital course:  Induction of Labor With Vaginal Delivery   26 y.o. yo [id number] at [redacted]w[redacted]d was admitted to the hospital 05/04/2017 for induction of labor.  Indication for induction: history of fast labor and advanced cervical dilation.   Patient had an uncomplicated labor course as follows: Membrane Rupture Time/Date: 7:07 PM ,05/04/2017   Patient had delivery of viable female at 7:59 PM, 05/04/2017 Details of delivery can be found in separate delivery note.    Patient had a routine postpartum course. Patient is discharged home on 05/06/2017 .  Physical exam  Vitals:   05/05/17 1520 05/05/17 2014 05/06/17 0437 05/06/17 0726  BP: 112/66 114/60 (!) 118/57 114/74  Pulse: 85 81 72 80  Resp: 18 16  18   Temp: 98.3 F (36.8 C) 98.2 F (36.8 C) 97.4 F (36.3 C) 97.8 F (36.6 C)  TempSrc: Oral Oral Oral Oral  SpO2: 99%  98% 98%  Weight:      Height:       General: alert, cooperative and no distress Lochia: appropriate Uterine Fundus: firm Incision: N/A DVT Evaluation: No evidence of DVT seen on physical exam.  Prenatal Labs: B+, Rubella Immune, Varicella Immune, GBS negative  Labs: Lab Results  Component Value Date   WBC 16.2 (H) 05/05/2017   HGB 12.3 05/05/2017   HCT 35.6 05/05/2017   MCV 89.1 05/05/2017   PLT 143 (L) 05/05/2017    Discharge instruction: per  After Visit Summary.  Medications:  Allergies as of 05/06/2017      Reactions   Penicillins Hives   Aspirin Other (See Comments)   Felt dizzy, like she was going to pass out      Medication List    TAKE these medications   ferrous sulfate 325 (65 FE) MG EC tablet Take 325 mg by mouth 3 (three) times daily with meals.   prenatal multivitamin Tabs tablet Take 1 tablet by mouth daily at 12 noon.   sertraline 50 MG tablet Commonly known as:  ZOLOFT Take 1.5 tablets daily       Diet: routine diet  Activity: Advance as tolerated. Pelvic rest for 6 weeks.   Outpatient follow up: Follow-up Information    Tresea Mall, CNM. Schedule an appointment as soon as possible for a visit in 2 week(s).   Specialty:  Obstetrics Why:  for postpartum mood check Contact information: [address] [place] [address] [phone]  Postpartum contraception: Vasectomy Rhogam Given postpartum: no Rubella vaccine given postpartum: no Varicella vaccine given postpartum: no TDaP given antepartum or postpartum: declines  Newborn Data: Live born female: Macy Birth Weight: 8 pounds 7 ounces  APGAR: 6, 8    Baby Feeding: Breast  Disposition:home with mother  SIGNED:  Thomasene MohairStephen Johathon Overturf, MD 05/06/2017 8:34 AM

## 2017-05-04 NOTE — Progress Notes (Signed)
Labor Progress Note   26 y.o. [id number] @ [redacted]w[redacted]d, admitted for  Pregnancy, Labor Management. IOL for history of rapid labors and advanced cervical dilation  Subjective:  Beginning to feel some pressure with contractions. Feeling sleepy and has a slight headache.   Objective:  BP (!) 141/73 (BP Location: Right Arm)   Pulse 99   Temp 98 F (36.7 C) (Oral)   Resp 18   Ht 5' 4"  (1.626 m)   Wt 183 lb (83 kg)   LMP 06/29/2016   BMI 31.41 kg/m  Abd: mild Extr: negative edema in lower ext. Mild swelling in left hand near IV site SVE: CERVIX: 7 cm dilated, 100% effaced, 0 station, membranes felt  EFM: FHR: 145 bpm, variability: moderate,  accelerations:  Present,  decelerations:  Absent Toco: Frequency: Every 2-3 minutes Labs:  Results for SJAMISE, P[place](MRN [id number] as of 05/04/2017 15:00  Ref. Range 05/04/2017 08:58 05/04/2017 08:58 05/04/2017 09:57  Potassium Latest Ref Range: 3.5 - 5.1 mmol/L 3.6    Chloride Latest Ref Range: 101 - 111 mmol/L 106    CO2 Latest Ref Range: 22 - 32 mmol/L 21 (L)    Glucose Latest Ref Range: 65 - 99 mg/dL 91    BUN Latest Ref Range: 6 - 20 mg/dL 7    Creatinine Latest Ref Range: 0.44 - 1.00 mg/dL 0.51    Calcium Latest Ref Range: 8.9 - 10.3 mg/dL 9.2    Anion gap Latest Ref Range: 5 - 15  11    Alkaline Phosphatase Latest Ref Range: 38 - 126 U/L 122    Albumin Latest Ref Range: 3.5 - 5.0 g/dL 2.9 (L)    AST Latest Ref Range: 15 - 41 U/L 31    ALT Latest Ref Range: 14 - 54 U/L 8 (L)    Total Protein Latest Ref Range: 6.5 - 8.1 g/dL 6.5    Total Bilirubin Latest Ref Range: 0.3 - 1.2 mg/dL 0.6    EGFR (African American) Latest Ref Range: >60 mL/min >60    EGFR (Non-African Amer.) Latest Ref Range: >60 mL/min >60    WBC Latest Ref Range: 3.6 - 11.0 K/uL 11.2 (H)    RBC Latest Ref Range: 3.80 - 5.20 MIL/uL 4.47    Hemoglobin Latest Ref Range: 12.0 - 16.0 g/dL 13.7    HCT Latest Ref Range: 35.0 - 47.0 % 40.0    MCV Latest Ref Range: 80.0 - 100.0 fL  89.6    MCH Latest Ref Range: 26.0 - 34.0 pg 30.8    MCHC Latest Ref Range: 32.0 - 36.0 g/dL 34.4    RDW Latest Ref Range: 11.5 - 14.5 % 14.9 (H)    Platelets Latest Ref Range: 150 - 440 K/uL 162    Sample Expiration Unknown   05/07/2017  Antibody Screen Unknown   NEG  ABO/RH(D) Unknown   B POS  Specimen source GC/Chlam Unknown TEST WILL BE CRED... URINE, RANDOM   Chlamydia Tr Latest Ref Range: NOT DETECTED  TEST WILL BE CRED... (A) NOT DETECTED   N gonorrhoeae Latest Ref Range: NOT DETECTED  TEST WILL BE CRED... (A) NOT DETECTED   Total Protein, Urine Latest Units: mg/dL 24    Protein Creatinine Ratio Latest Ref Range: 0.00 - 0.15 mg/mgCre 0.16 (H)    Creatinine, Urine Latest Units: mg/dL 152      Assessment & Plan:  [id number]@ 369w3dadmitted for  Pregnancy and Labor/Delivery Management  1. Pain management: position changes. 2. FWB:  FHT category I.  3. ID: GBS negative 4. Labor management: pitocin  All discussed with patient, see orders  Rod Can, CNM

## 2017-05-05 ENCOUNTER — Encounter: Payer: 59 | Admitting: Obstetrics & Gynecology

## 2017-05-05 LAB — RPR: RPR Ser Ql: NONREACTIVE

## 2017-05-05 LAB — CBC
HCT: 35.6 % (ref 35.0–47.0)
HEMOGLOBIN: 12.3 g/dL (ref 12.0–16.0)
MCH: 30.7 pg (ref 26.0–34.0)
MCHC: 34.4 g/dL (ref 32.0–36.0)
MCV: 89.1 fL (ref 80.0–100.0)
Platelets: 143 10*3/uL — ABNORMAL LOW (ref 150–440)
RBC: 4 MIL/uL (ref 3.80–5.20)
RDW: 14.5 % (ref 11.5–14.5)
WBC: 16.2 10*3/uL — ABNORMAL HIGH (ref 3.6–11.0)

## 2017-05-05 NOTE — Progress Notes (Signed)
Subjective:  Doing well no concerns, appropriate lochia.  No fevers, no chills.  Objective:   Blood pressure 114/73, pulse 75, temperature 98 F (36.7 C), temperature source Oral, resp. rate 20, height '5\' 4"'$  (1.626 m), weight 183 lb (83 kg), last menstrual period 06/29/2016, SpO2 99 %.  General: NAD Pulmonary: no increased work of breathing Abdomen: non-distended, non-tender, fundus firm at level of umbilicus Extremities: no edema, no erythema, no tenderness  Results for orders placed or performed during the hospital encounter of 05/04/17 (from the past 72 hour(s))  CBC     Status: Abnormal   Collection Time: 05/04/17  8:58 AM  Result Value Ref Range   WBC 11.2 (H) 3.6 - 11.0 K/uL   RBC 4.47 3.80 - 5.20 MIL/uL   Hemoglobin 13.7 12.0 - 16.0 g/dL   HCT 40.0 35.0 - 47.0 %   MCV 89.6 80.0 - 100.0 fL   MCH 30.8 26.0 - 34.0 pg   MCHC 34.4 32.0 - 36.0 g/dL   RDW 14.9 (H) 11.5 - 14.5 %   Platelets 162 150 - 440 K/uL  RPR     Status: None   Collection Time: 05/04/17  8:58 AM  Result Value Ref Range   RPR Ser Ql Non Reactive Non Reactive    Comment: (NOTE) Performed At: [facility] [place], [place] [id number] Lindon Romp MD WU:[id number]   [place] rt PCR ([place] only)     Status: Abnormal   Collection Time: 05/04/17  8:58 AM  Result Value Ref Range   Specimen source GC/Chlam TEST WILL BE CREDITED     Comment: RECEIVED BY MISTAKE CORRECTED ON 06/19 AT 1253: PREVIOUSLY REPORTED AS URINE, RANDOM    Chlamydia Tr TEST WILL BE CREDITED (A) NOT DETECTED    Comment: CORRECTED ON 06/19 AT 1253: PREVIOUSLY REPORTED AS DETECTED   N gonorrhoeae TEST WILL BE CREDITED (A) NOT DETECTED    Comment: TEST WILL BE CREDITED (NOTE) 100  This methodology has not been evaluated in pregnant women or in 200  patients with a history of hysterectomy. 300 400  This methodology will not be performed on patients less than 21  years of age. CORRECTED ON 06/19 AT 1253:  PREVIOUSLY REPORTED AS NOT DETECTED   Comprehensive metabolic panel     Status: Abnormal   Collection Time: 05/04/17  8:58 AM  Result Value Ref Range   Sodium 138 135 - 145 mmol/L   Potassium 3.6 3.5 - 5.1 mmol/L    Comment: HEMOLYSIS AT THIS LEVEL MAY AFFECT RESULT   Chloride 106 101 - 111 mmol/L   CO2 21 (L) 22 - 32 mmol/L   Glucose, Bld 91 65 - 99 mg/dL   BUN 7 6 - 20 mg/dL   Creatinine, Ser 0.51 0.44 - 1.00 mg/dL   Calcium 9.2 8.9 - 10.3 mg/dL   Total Protein 6.5 6.5 - 8.1 g/dL   Albumin 2.9 (L) 3.5 - 5.0 g/dL   AST 31 15 - 41 U/L   ALT 8 (L) 14 - 54 U/L   Alkaline Phosphatase 122 38 - 126 U/L   Total Bilirubin 0.6 0.3 - 1.2 mg/dL   GFR calc non Af Amer >60 >60 mL/min   GFR calc Af Amer >60 >60 mL/min    Comment: (NOTE) The eGFR has been calculated using the CKD EPI equation. This calculation has not been validated in all clinical situations. eGFR's persistently <60 mL/min signify possible Chronic Kidney Disease.    Anion gap  11 5 - 15  Protein / creatinine ratio, urine     Status: Abnormal   Collection Time: 05/04/17  8:58 AM  Result Value Ref Range   Creatinine, Urine 152 mg/dL   Total Protein, Urine 24 mg/dL    Comment: NO NORMAL RANGE ESTABLISHED FOR THIS TEST   Protein Creatinine Ratio 0.16 (H) 0.00 - 0.15 mg/mg[Cre]  Chlamydia/NGC rt PCR ([facility] only)     Status: None   Collection Time: 05/04/17  8:58 AM  Result Value Ref Range   Specimen source GC/Chlam URINE, RANDOM    Chlamydia Tr NOT DETECTED NOT DETECTED   N gonorrhoeae NOT DETECTED NOT DETECTED    Comment: (NOTE) 100  This methodology has not been evaluated in pregnant women or in 200  patients with a history of hysterectomy. 300 400  This methodology will not be performed on patients less than 58  years of age.   Type and screen     Status: None   Collection Time: 05/04/17  9:57 AM  Result Value Ref Range   ABO/RH(D) B POS    Antibody Screen NEG    Sample Expiration 05/07/2017   CBC     Status:  Abnormal   Collection Time: 05/05/17  6:11 AM  Result Value Ref Range   WBC 16.2 (H) 3.6 - 11.0 K/uL   RBC 4.00 3.80 - 5.20 MIL/uL   Hemoglobin 12.3 12.0 - 16.0 g/dL   HCT 35.6 35.0 - 47.0 %   MCV 89.1 80.0 - 100.0 fL   MCH 30.7 26.0 - 34.0 pg   MCHC 34.4 32.0 - 36.0 g/dL   RDW 14.5 11.5 - 14.5 %   Platelets 143 (L) 150 - 440 K/uL    Assessment:   26 y.o. G3P2002 postpartum day # 1 TSVD  Plan:    1) Acute blood loss anemia - hemodynamically stable and asymptomatic - po ferrous sulfate  2) Blood Type --/--/B POS (06/19 0957) / Rubella Immune (11/03 0000) / Varicella Immune  3) TDAP - needs  4) Breast/ status post vasectomy  5) Disposition anticipate discharge PPD2

## 2017-05-06 NOTE — Progress Notes (Signed)
Patient discharged home with infant and spouse. Discharge instructions, prescriptions and follow up appointment given to and reviewed with patient and spouse. Patient verbalized understanding. Escorted out via wheelchair by auxiliary.  

## 2017-05-10 ENCOUNTER — Telehealth: Payer: Self-pay

## 2017-05-10 NOTE — Telephone Encounter (Signed)
Delora FuelJamie Ault from [employer] called wanted to confirm pt's delivery date and type.  Left detailed msg pt del on 05/04/17 vaginally.

## 2017-05-13 ENCOUNTER — Telehealth: Payer: Self-pay

## 2017-05-13 NOTE — Telephone Encounter (Signed)
FMLA/DISABILITY additional info filled out and given to [place] for processing.

## 2017-05-21 ENCOUNTER — Ambulatory Visit (INDEPENDENT_AMBULATORY_CARE_PROVIDER_SITE_OTHER): Payer: 59 | Admitting: Obstetrics & Gynecology

## 2017-05-21 ENCOUNTER — Encounter: Payer: Self-pay | Admitting: Obstetrics & Gynecology

## 2017-05-21 VITALS — BP 110/60 | HR 80 | Ht 64.0 in | Wt 156.0 lb

## 2017-05-21 DIAGNOSIS — O99342 Other mental disorders complicating pregnancy, second trimester: Secondary | ICD-10-CM

## 2017-05-21 DIAGNOSIS — F53 Postpartum depression: Secondary | ICD-10-CM | POA: Insufficient documentation

## 2017-05-21 DIAGNOSIS — O99345 Other mental disorders complicating the puerperium: Principal | ICD-10-CM

## 2017-05-21 DIAGNOSIS — F329 Major depressive disorder, single episode, unspecified: Secondary | ICD-10-CM

## 2017-05-21 MED ORDER — SERTRALINE HCL 50 MG PO TABS: ORAL_TABLET | ORAL | 3 refills | Status: DC

## 2017-05-21 NOTE — Progress Notes (Signed)
  History of Present Illness:  Sara Harrison is a 26 y.o. who was started on ZOLOFT approximately 2 weeks ago. Since that time, she states that her symptoms are improving.  She has been on Zoloft in past for PPD and depression general.  Not on much this pregnancy until delivery.  No SI, or HI.  Breast feeding going well. Husband has had vasectomy.  PMHx: She  has a past medical history of Asthma; Chlamydia; Dyspareunia, female; History of precipitous delivery; Irregular menses; Migraine; Postpartum depression; and Seasonal allergies. Also,  has a past surgical history that includes Intrauterine device (iud) insertion (06/02/2010) and IUD removal., family history includes Heart disease in her maternal grandmother; Heart failure in her paternal grandfather; Hypertension in her maternal grandfather; Thyroid disease in her paternal grandfather.,  reports that she has quit smoking. She has never used smokeless tobacco. She reports that she does not drink alcohol or use drugs. No outpatient prescriptions have been marked as taking for the 05/21/17 encounter (Postpartum Visit) with Nadara MustardHarris, Robert P, MD.  . Also, is allergic to penicillins and aspirin..  Review of Systems  All other systems reviewed and are negative.   Physical Exam:  BP 110/60   Pulse 80   Ht 5\' 4"  (1.626 m)   Wt 156 lb (70.8 kg)   BMI 26.78 kg/m  Body mass index is 26.78 kg/m. Constitutional: Well nourished, well developed female in no acute distress.  Abdomen: diffusely non tender to palpation, non distended, and no masses, hernias Neuro: Grossly intact Psych:  Normal mood and affect.    Assessment:  Problem List Items Addressed This Visit      Other   Depression   Relevant Medications   sertraline (ZOLOFT) 50 MG tablet   Post partum depression - Primary   Relevant Medications   sertraline (ZOLOFT) 50 MG tablet     Medication treatment is going very well for her PPD.  Plan: She will undergo no change in her medical  therapy.  She was amenable to this plan and we will see her back for annual/PRN.  Annamarie MajorPaul Harris, MD, Merlinda FrederickFACOG [facility], [facility] 05/21/2017  11:12 AM

## 2017-06-17 ENCOUNTER — Ambulatory Visit (INDEPENDENT_AMBULATORY_CARE_PROVIDER_SITE_OTHER): Payer: 59 | Admitting: Advanced Practice Midwife

## 2017-06-17 ENCOUNTER — Encounter: Payer: Self-pay | Admitting: Advanced Practice Midwife

## 2017-06-17 DIAGNOSIS — Z124 Encounter for screening for malignant neoplasm of cervix: Secondary | ICD-10-CM

## 2017-06-17 NOTE — Progress Notes (Signed)
Postpartum Visit  Chief Complaint: No chief complaint on file.   History of Present Illness: Patient is a 26 y.o. [id number] presents for postpartum visit.  Date of delivery: 05/04/2017 Type of delivery: Vaginal delivery - Vacuum or forceps assisted  no Episiotomy No.  Laceration: no  Pregnancy or labor problems:  no Any problems since the delivery:  no  Newborn Details:  SINGLETON :  1. Baby's name: Sara Harrison. Birth weight: 8 pounds 7 ounces Maternal Details:  Breast Feeding:  yes Post partum depression/anxiety noted:  No, taking Zoloft [place] Post-Partum Depression Score:  5  Date of last PAP: March 2017  normal   Review of Systems: Review of Systems  Constitutional: Negative.   HENT: Negative.   Eyes: Negative.   Respiratory: Negative.   Cardiovascular: Negative.   Gastrointestinal: Negative.   Genitourinary: Negative.   Musculoskeletal: Negative.   Skin: Negative.   Neurological: Negative.   Endo/Heme/Allergies: Negative.   Psychiatric/Behavioral: Negative.     Past Medical History:  Past Medical History:  Diagnosis Date  . Asthma   . Chlamydia   . Dyspareunia, female   . History of precipitous delivery   . Irregular menses   . Migraine   . Postpartum depression   . Seasonal allergies     Past Surgical History:  Past Surgical History:  Procedure Laterality Date  . INTRAUTERINE DEVICE (IUD) INSERTION  06/02/2010  . IUD REMOVAL      Family History:  Family History  Problem Relation Age of Onset  . Heart disease Maternal Grandmother   . Hypertension Maternal Grandfather   . Heart failure Paternal Grandfather   . Thyroid disease Paternal Grandfather     Social History:  Social History   Social History  . Marital status: Married    Spouse name: N/A  . Number of children: N/A  . Years of education: N/A   Occupational History  . Not on file.   Social History Main Topics  . Smoking status: Former Games developermoker  . Smokeless tobacco: Never Used  .  Alcohol use No  . Drug use: No  . Sexual activity: Yes   Other Topics Concern  . Not on file   Social History Narrative  . No narrative on file    Allergies:  Allergies  Allergen Reactions  . Penicillins Hives  . Aspirin Other (See Comments)    Felt dizzy, like she was going to pass out    Medications: Prior to Admission medications   Medication Sig Start Date End Date Taking? Authorizing Provider  ferrous sulfate 325 (65 FE) MG EC tablet Take 325 mg by mouth 3 (three) times daily with meals.   Yes [provider]  Prenatal Vit-Fe Fumarate-FA (PRENATAL MULTIVITAMIN) TABS tablet Take 1 tablet by mouth daily at 12 noon.   Yes [provider]  sertraline (ZOLOFT) 50 MG tablet Take 1 tablets daily 05/21/17  Yes Nadara MustardHarris, Robert P, MD    Physical Exam Vitals:  Vitals:   06/17/17 1054  BP: 118/70    General: NAD HEENT: normocephalic, anicteric Pulmonary: No increased work of breathing Abdomen: NABS, soft, non-tender, non-distended.  Umbilicus without lesions.  No hepatomegaly, splenomegaly or masses palpable. No evidence of hernia. Incision: NA Genitourinary:  External: Normal external female genitalia.  Normal urethral meatus, normal  Bartholin's and Skene's glands.    Vagina: Normal vaginal mucosa, no evidence of prolapse.    Cervix: Grossly normal in appearance, no bleeding, no CMT  Uterus: Non-enlarged, mobile, normal  contour.    Adnexa: ovaries non-enlarged, no adnexal masses  Rectal: deferred Extremities: no edema, erythema, or tenderness Neurologic: Grossly intact Psychiatric: mood appropriate, affect full  Assessment: 26 y.o. [id number] presenting for 6 week postpartum visit  Plan: Problem List Items Addressed This Visit    None    Visit Diagnoses    6 weeks postpartum follow-up    -  Primary   Relevant Orders   IGP, rfx Aptima HPV ASCU   Cervical cancer screening       Relevant Orders   IGP, rfx Aptima HPV ASCU       1) Contraception:  husband has had vasectomy  2)  Pap - ASCCP guidelines and rationale discussed.  Patient opts for yearly screening interval  3) Patient underwent screening for postpartum depression with no concerns noted.  4) Follow up 1 year for routine annual exam   Sara Harrison, CNM

## 2017-06-21 LAB — IGP, RFX APTIMA HPV ASCU: PAP Smear Comment: 0

## 2018-01-06 ENCOUNTER — Encounter: Payer: Self-pay | Admitting: Obstetrics & Gynecology

## 2018-01-06 ENCOUNTER — Ambulatory Visit: Payer: Managed Care, Other (non HMO) | Admitting: Obstetrics & Gynecology

## 2018-01-06 VITALS — BP 100/60 | HR 69 | Ht 64.0 in | Wt 147.0 lb

## 2018-01-06 DIAGNOSIS — O99342 Other mental disorders complicating pregnancy, second trimester: Secondary | ICD-10-CM

## 2018-01-06 DIAGNOSIS — R234 Changes in skin texture: Secondary | ICD-10-CM | POA: Diagnosis not present

## 2018-01-06 DIAGNOSIS — F329 Major depressive disorder, single episode, unspecified: Secondary | ICD-10-CM

## 2018-01-06 MED ORDER — SERTRALINE HCL 50 MG PO TABS: ORAL_TABLET | ORAL | 1 refills | Status: DC

## 2018-01-06 NOTE — Progress Notes (Signed)
HPI:      Sara Harrison is a 27 y.o. [phone] who is premenopausal, presents today for a problem visit.  She complains of breast lump on the rightside which she first noticed today.  It has not significantly changed.  Associated symptoms include none.  Denies nipple discharge or skin changes.  No fever.  She describes areolar beside nipple tender swelling on skin. Stopped breast feeding 3 mos ago. No recent stress, change in diet, new meds.  Not on hormones.  Reg cycles. Prior breast problems: No Family History: Breast Cancer-relatedfamily history is not on file.   Also, pt reports continued need for depression therapy after pregnancy last year.  No SI, HI; functional; tol Zoloft well.  PMHx: She  has a past medical history of Asthma, Chlamydia, Dyspareunia, female, History of precipitous delivery, Irregular menses, Migraine, Postpartum depression, and Seasonal allergies. Also,  has a past surgical history that includes Intrauterine device (iud) insertion (06/02/2010) and IUD removal., family history includes Heart disease in her maternal grandmother; Heart failure in her paternal grandfather; Hypertension in her maternal grandfather; Thyroid disease in her paternal grandfather.,  reports that she has quit smoking. she has never used smokeless tobacco. She reports that she does not drink alcohol or use drugs.  She has a current medication list which includes the following prescription(s): ferrous sulfate, sertraline, and prenatal multivitamin. Also, is allergic to penicillins and aspirin.  Review of Systems  Constitutional: Negative for chills, fever and malaise/fatigue.  HENT: Negative for congestion, sinus pain and sore throat.   Eyes: Negative for blurred vision and pain.  Respiratory: Negative for cough and wheezing.   Cardiovascular: Negative for chest pain and leg swelling.  Gastrointestinal: Negative for abdominal pain, constipation, diarrhea, heartburn, nausea and vomiting.    Genitourinary: Negative for dysuria, frequency, hematuria and urgency.  Musculoskeletal: Negative for back pain, joint pain, myalgias and neck pain.  Skin: Negative for itching and rash.  Neurological: Negative for dizziness, tremors and weakness.  Endo/Heme/Allergies: Does not bruise/bleed easily.  Psychiatric/Behavioral: Negative for depression. The patient is not nervous/anxious and does not have insomnia.     Objective: BP 100/60   Pulse 69   Ht 5\' 4"  (1.626 m)   Wt 147 lb (66.7 kg)   LMP 12/17/2017   BMI 25.23 kg/m  Physical Exam  Constitutional: She is oriented to person, place, and time. She appears well-developed and well-nourished. No distress.  Cardiovascular: Normal rate, regular rhythm, normal heart sounds and normal pulses. Exam reveals no gallop and no friction rub.  No murmur heard. Pulmonary/Chest: Effort normal and breath sounds normal. She exhibits no mass, no tenderness and no edema. Right breast exhibits no inverted nipple, no mass, no nipple discharge, no skin change and no tenderness. Left breast exhibits no inverted nipple, no mass, no nipple discharge, no skin change and no tenderness. There is no breast swelling.  Ductal changes in the areola No mass, d/c  Abdominal: Normal appearance.  Musculoskeletal: Normal range of motion.  Lymphadenopathy:    She has no axillary adenopathy.       Right axillary: No pectoral and no lateral adenopathy present.       Left axillary: No pectoral and no lateral adenopathy present. Neurological: She is alert and oriented to person, place, and time.  Skin: Skin is warm and dry. No abrasion, no bruising, no lesion and no rash noted. No erythema.  Psychiatric: She has a normal mood and affect. Her speech is normal and behavior is  normal. Judgment normal.  Vitals reviewed.  ASSESSMENT/PLAN: normal breast exam 1. Breast skin changes - Counseled as to normal findings today, reassured.  No s/sx cancer, FCC.  Monitor for growth or  persistance of any mass.  2. Depression following pregnancy, still requires SSRI tx - sertraline (ZOLOFT) 50 MG tablet; Take 1 tablets daily  Dispense: 90 tablet; Refill: 1  A total of 15 minutes were spent face-to-face with the patient during this encounter and over half of that time dealt with counseling and coordination of care.  Annamarie Major, MD, [facility], [facility] 01/06/2018  3:24 PM

## 2018-04-19 ENCOUNTER — Other Ambulatory Visit: Payer: Self-pay | Admitting: Ophthalmology

## 2018-04-19 ENCOUNTER — Ambulatory Visit
Admission: RE | Admit: 2018-04-19 | Discharge: 2018-04-19 | Disposition: A | Discharge status: Home / Self Care | Payer: Managed Care, Other (non HMO) | Source: Ambulatory Visit | Attending: Ophthalmology | Admitting: Ophthalmology

## 2018-04-19 DIAGNOSIS — H4040X0 Glaucoma secondary to eye inflammation, unspecified eye, stage unspecified: Principal | ICD-10-CM

## 2018-04-19 DIAGNOSIS — H209 Unspecified iridocyclitis: Secondary | ICD-10-CM

## 2018-04-19 DIAGNOSIS — R0602 Shortness of breath: Secondary | ICD-10-CM | POA: Diagnosis not present

## 2018-06-02 ENCOUNTER — Ambulatory Visit: Payer: Self-pay | Admitting: Internal Medicine

## 2019-03-03 IMAGING — CR DG CHEST 2V
2 series · 2 of 2 positions shown · non-contrast
Comparison: None.

CLINICAL DATA: Glaucomatocyclic crisis.  Asthma.  Smoker.

EXAM:
CHEST - 2 VIEW

[chest pa]
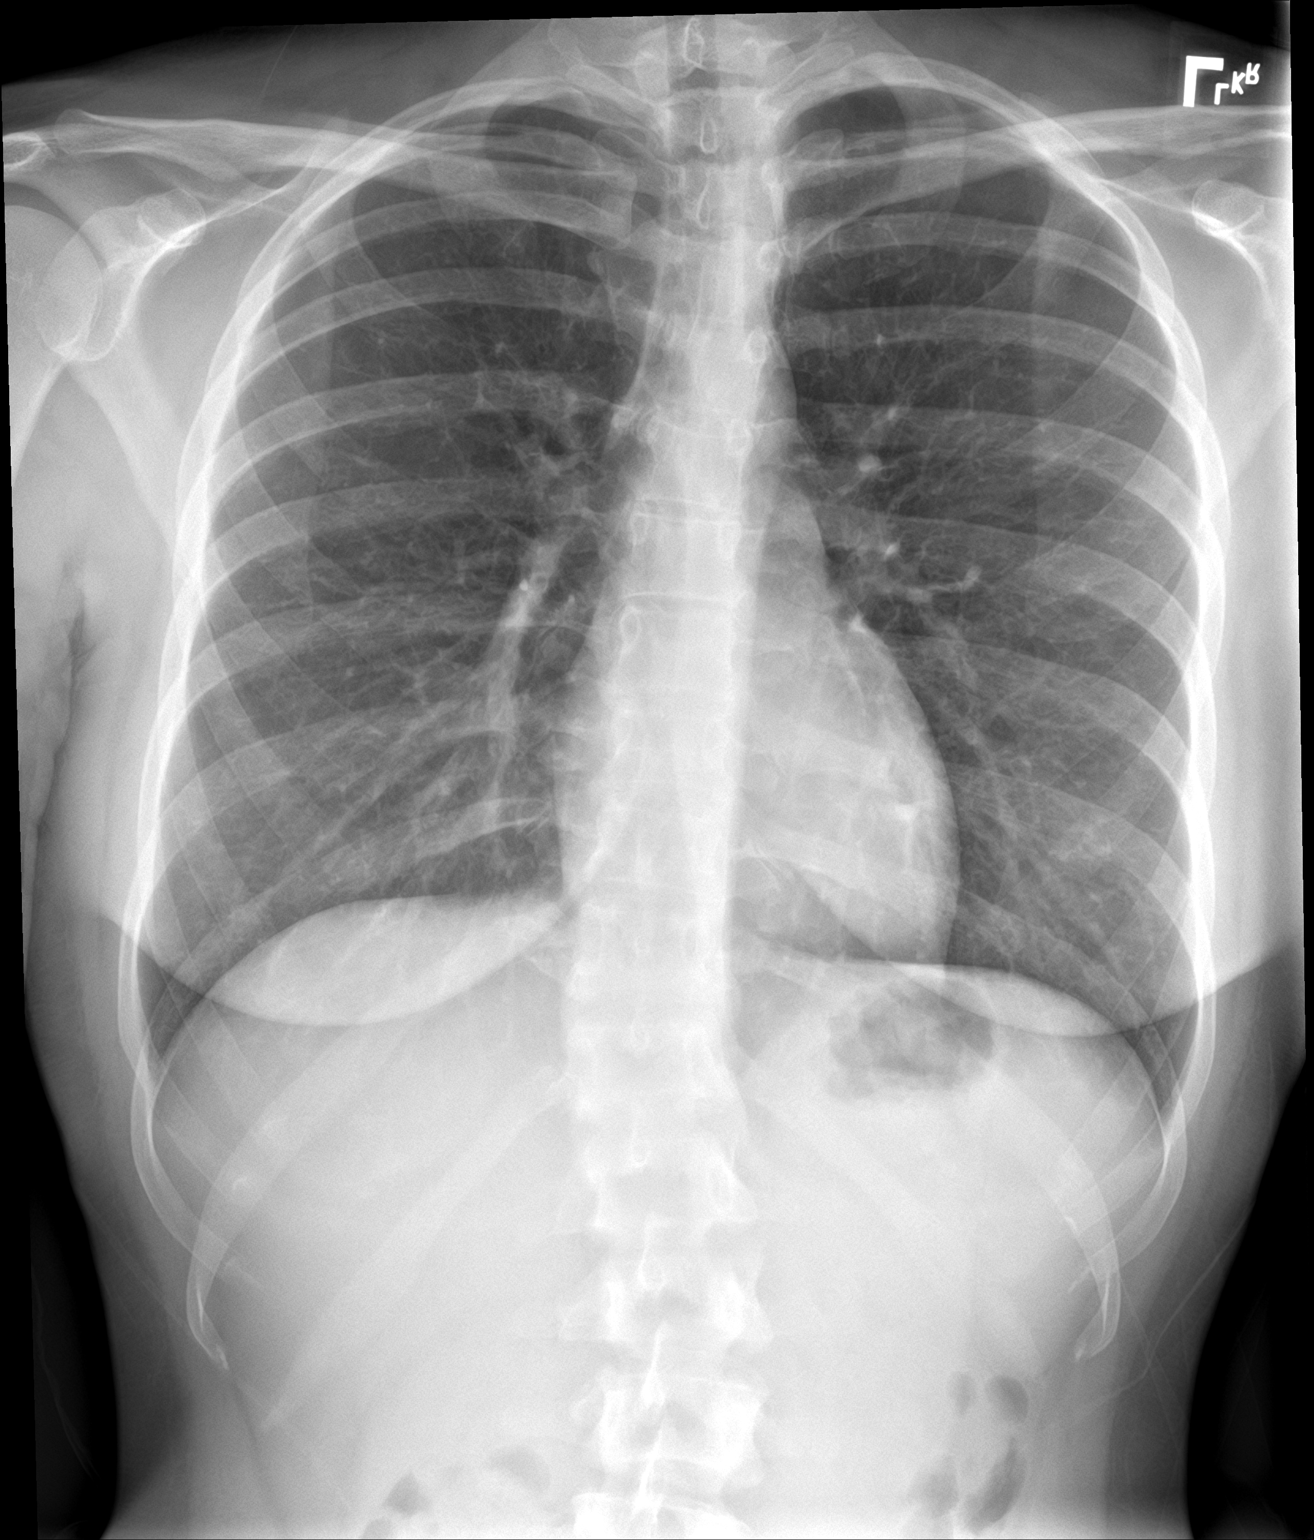

[chest lat]
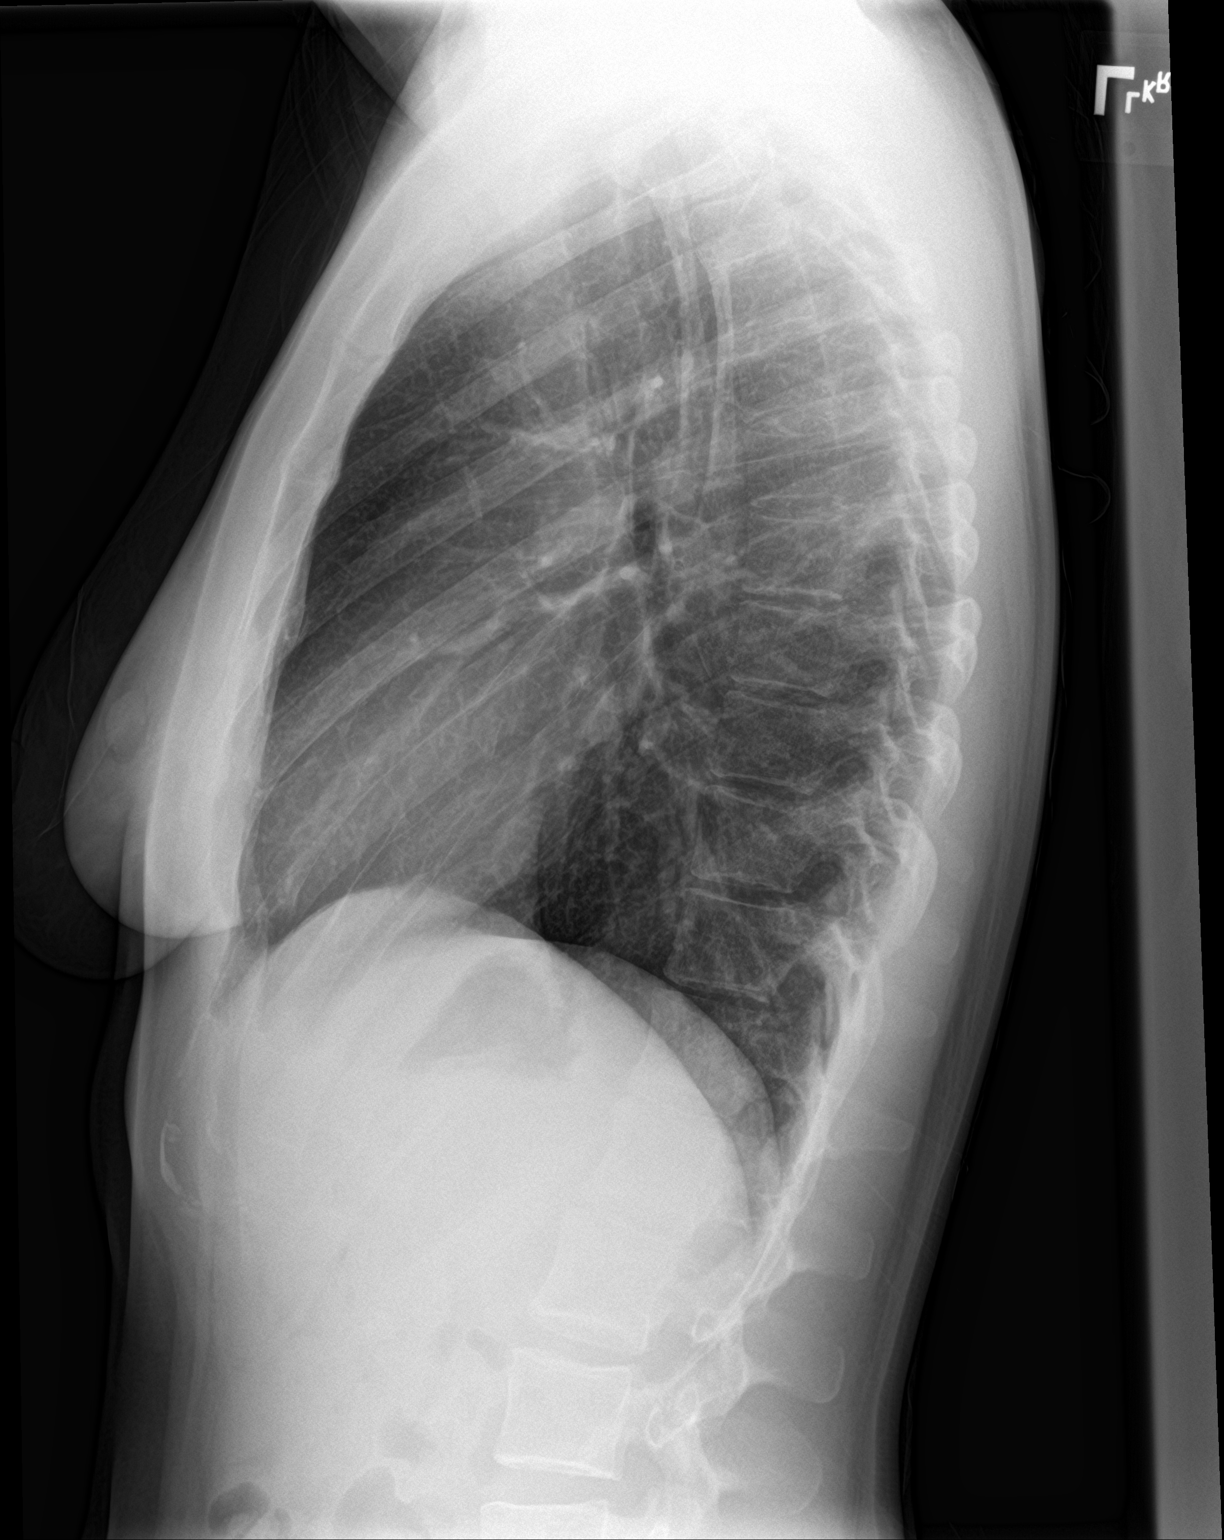

[2 of 2 positions shown; findings below may reference images not displayed]

FINDINGS: The heart size and mediastinal contours are within normal limits.
Both lungs are clear. Minimal thoracolumbar scoliosis noted.
IMPRESSION: No active cardiopulmonary disease.

## 2020-11-16 HISTORY — PX: WISDOM TOOTH EXTRACTION: SHX21

## 2021-03-19 ENCOUNTER — Other Ambulatory Visit: Payer: Self-pay

## 2021-03-19 ENCOUNTER — Encounter: Payer: Self-pay | Admitting: Adult Health

## 2021-03-19 ENCOUNTER — Other Ambulatory Visit: Payer: BLUE CROSS/BLUE SHIELD

## 2021-03-19 ENCOUNTER — Ambulatory Visit: Payer: BLUE CROSS/BLUE SHIELD | Admitting: Adult Health

## 2021-03-19 VITALS — BP 94/58 | HR 87 | Temp 97.2°F | Ht 64.76 in | Wt 145.4 lb

## 2021-03-19 DIAGNOSIS — Z8349 Family history of other endocrine, nutritional and metabolic diseases: Secondary | ICD-10-CM | POA: Insufficient documentation

## 2021-03-19 DIAGNOSIS — E559 Vitamin D deficiency, unspecified: Secondary | ICD-10-CM | POA: Diagnosis not present

## 2021-03-19 DIAGNOSIS — Z1389 Encounter for screening for other disorder: Secondary | ICD-10-CM | POA: Diagnosis not present

## 2021-03-19 DIAGNOSIS — R5383 Other fatigue: Secondary | ICD-10-CM | POA: Diagnosis not present

## 2021-03-19 LAB — COMPREHENSIVE METABOLIC PANEL
ALT: 6 U/L (ref 0–35)
AST: 12 U/L (ref 0–37)
Albumin: 4.4 g/dL (ref 3.5–5.2)
Alkaline Phosphatase: 53 U/L (ref 39–117)
BUN: 10 mg/dL (ref 6–23)
CO2: 30 mEq/L (ref 19–32)
Calcium: 9.4 mg/dL (ref 8.4–10.5)
Chloride: 103 mEq/L (ref 96–112)
Creatinine, Ser: 0.69 mg/dL (ref 0.40–1.20)
GFR: 117.07 mL/min (ref >60.00)
Glucose, Bld: 81 mg/dL (ref 70–99)
Potassium: 4.4 mEq/L (ref 3.5–5.1)
Sodium: 138 mEq/L (ref 135–145)
Total Bilirubin: 0.6 mg/dL (ref 0.2–1.2)
Total Protein: 6.8 g/dL (ref 6.0–8.3)

## 2021-03-19 LAB — B12 AND FOLATE PANEL
Folate: 13 ng/mL (ref >5.9)
Vitamin B-12: 487 pg/mL (ref 211–911)

## 2021-03-19 LAB — CBC WITH DIFFERENTIAL/PLATELET
Basophils Absolute: 0 10*3/uL (ref 0.0–0.1)
Basophils Relative: 0.5 % (ref 0.0–3.0)
Eosinophils Absolute: 0.1 10*3/uL (ref 0.0–0.7)
Eosinophils Relative: 1.1 % (ref 0.0–5.0)
HCT: 39 % (ref 36.0–46.0)
Hemoglobin: 13.3 g/dL (ref 12.0–15.0)
Lymphocytes Relative: 28.3 % (ref 12.0–46.0)
Lymphs Abs: 1.6 10*3/uL (ref 0.7–4.0)
MCHC: 34.1 g/dL (ref 30.0–36.0)
MCV: 87.3 fl (ref 78.0–100.0)
Monocytes Absolute: 0.4 10*3/uL (ref 0.1–1.0)
Monocytes Relative: 6.5 % (ref 3.0–12.0)
Neutro Abs: 3.6 10*3/uL (ref 1.4–7.7)
Neutrophils Relative %: 63.6 % (ref 43.0–77.0)
Platelets: 247 10*3/uL (ref 150.0–400.0)
RBC: 4.47 Mil/uL (ref 3.87–5.11)
RDW: 12.7 % (ref 11.5–15.5)
WBC: 5.7 10*3/uL (ref 4.0–10.5)

## 2021-03-19 LAB — URINALYSIS, ROUTINE W REFLEX MICROSCOPIC
Bilirubin Urine: NEGATIVE
Hgb urine dipstick: NEGATIVE
Ketones, ur: NEGATIVE
Leukocytes,Ua: NEGATIVE
Nitrite: NEGATIVE
RBC / HPF: NONE SEEN (ref 0–?)
Specific Gravity, Urine: <=1.005 — AB (ref 1.000–1.030)
Total Protein, Urine: NEGATIVE
Urine Glucose: NEGATIVE
Urobilinogen, UA: 0.2 (ref 0.0–1.0)
pH: 6 (ref 5.0–8.0)

## 2021-03-19 LAB — LIPID PANEL
Cholesterol: 160 mg/dL (ref 0–200)
HDL: 49.1 mg/dL (ref >39.00)
LDL Cholesterol: 98 mg/dL (ref 0–99)
NonHDL: 110.53
Total CHOL/HDL Ratio: 3
Triglycerides: 64 mg/dL (ref 0.0–149.0)
VLDL: 12.8 mg/dL (ref 0.0–40.0)

## 2021-03-19 LAB — VITAMIN D 25 HYDROXY (VIT D DEFICIENCY, FRACTURES): VITD: 25.31 ng/mL — ABNORMAL LOW (ref 30.00–100.00)

## 2021-03-19 NOTE — Progress Notes (Signed)
Add on urine culture due to bacteria on urinalysis. If able to add to current urine.

## 2021-03-19 NOTE — Patient Instructions (Addendum)
Fatigue If you have fatigue, you feel tired all the time and have a lack of energy or a lack of motivation. Fatigue may make it difficult to start or complete tasks because of exhaustion. In general, occasional or mild fatigue is often a normal response to activity or life. However, long-lasting (chronic) or extreme fatigue may be a symptom of a medical condition. Follow these instructions at home: General instructions  Watch your fatigue for any changes.  Go to bed and get up at the same time every day.  Avoid fatigue by pacing yourself during the day and getting enough sleep at night.  Maintain a healthy weight. Medicines  Take over-the-counter and prescription medicines only as told by your health care provider.  Take a multivitamin, if told by your health care provider.  Do not use herbal or dietary supplements unless they are approved by your health care provider. Activity  Exercise regularly, as told by your health care provider.  Use or practice techniques to help you relax, such as yoga, tai chi, meditation, or massage therapy.   Eating and drinking  Avoid heavy meals in the evening.  Eat a well-balanced diet, which includes lean proteins, whole grains, plenty of fruits and vegetables, and low-fat dairy products.  Avoid consuming too much caffeine.  Avoid the use of alcohol.  Drink enough fluid to keep your urine pale yellow.   Lifestyle  Change situations that cause you stress. Try to keep your work and personal schedule in balance.  Do not use any products that contain nicotine or tobacco, such as cigarettes and e-cigarettes. If you need help quitting, ask your health care provider.  Do not use drugs. Contact a health care provider if:  Your fatigue does not get better.  You have a fever.  You suddenly lose or gain weight.  You have headaches.  You have trouble falling asleep or sleeping through the night.  You feel angry, guilty, anxious, or  sad.  You are unable to have a bowel movement (constipation).  Your skin is dry.  You have swelling in your legs or another part of your body. Get help right away if:  You feel confused.  Your vision is blurry.  You feel faint or you pass out.  You have a severe headache.  You have severe pain in your abdomen, your back, or the area between your waist and hips (pelvis).  You have chest pain, shortness of breath, or an irregular or fast heartbeat.  You are unable to urinate, or you urinate less than normal.  You have abnormal bleeding, such as bleeding from the rectum, vagina, nose, lungs, or nipples.  You vomit blood.  You have thoughts about hurting yourself or others. If you ever feel like you may hurt yourself or others, or have thoughts about taking your own life, get help right away. You can go to your nearest emergency department or call:  Your local emergency services (911 in the [place]  A suicide crisis helpline, such as the National Suicide Prevention Lifeline at 1-800-273-8255. This is open 24 hours a day. Summary  If you have fatigue, you feel tired all the time and have a lack of energy or a lack of motivation.  Fatigue may make it difficult to start or complete tasks because of exhaustion.  Long-lasting (chronic) or extreme fatigue may be a symptom of a medical condition.  Exercise regularly, as told by your health care provider.  Change situations that cause you stress. Try to   keep your work and personal schedule in balance. This information is not intended to replace advice given to you by your health care provider. Make sure you discuss any questions you have with your health care provider. Document Revised: 05/24/2019 Document Reviewed: 07/28/2017 Elsevier Patient Education  2021 [place] Maintenance, Female Adopting a healthy lifestyle and getting preventive care are important in promoting health and wellness. Ask your health care  provider about:  The right schedule for you to have regular tests and exams.  Things you can do on your own to prevent diseases and keep yourself healthy. What should I know about diet, weight, and exercise? Eat a healthy diet  Eat a diet that includes plenty of vegetables, fruits, low-fat dairy products, and lean protein.  Do not eat a lot of foods that are high in solid fats, added sugars, or sodium.   Maintain a healthy weight Body mass index (BMI) is used to identify weight problems. It estimates body fat based on height and weight. Your health care provider can help determine your BMI and help you achieve or maintain a healthy weight. Get regular exercise Get regular exercise. This is one of the most important things you can do for your health. Most adults should:  Exercise for at least 150 minutes each week. The exercise should increase your heart rate and make you sweat (moderate-intensity exercise).  Do strengthening exercises at least twice a week. This is in addition to the moderate-intensity exercise.  Spend less time sitting. Even light physical activity can be beneficial. Watch cholesterol and blood lipids Have your blood tested for lipids and cholesterol at 30 years of age, then have this test every 5 years. Have your cholesterol levels checked more often if:  Your lipid or cholesterol levels are high.  You are older than 30 years of age.  You are at high risk for heart disease. What should I know about cancer screening? Depending on your health history and family history, you may need to have cancer screening at various ages. This may include screening for:  Breast cancer.  Cervical cancer.  Colorectal cancer.  Skin cancer.  Lung cancer. What should I know about heart disease, diabetes, and high blood pressure? Blood pressure and heart disease  High blood pressure causes heart disease and increases the risk of stroke. This is more likely to develop in  people who have high blood pressure readings, are of African descent, or are overweight.  Have your blood pressure checked: ? Every 3-5 years if you are 76-8 years of age. ? Every year if you are 37 years old or older. Diabetes Have regular diabetes screenings. This checks your fasting blood sugar level. Have the screening done:  Once every three years after age 47 if you are at a normal weight and have a low risk for diabetes.  More often and at a younger age if you are overweight or have a high risk for diabetes. What should I know about preventing infection? Hepatitis B If you have a higher risk for hepatitis B, you should be screened for this virus. Talk with your health care provider to find out if you are at risk for hepatitis B infection. Hepatitis C Testing is recommended for:  Everyone born from [birth year] through 1965.  Anyone with known risk factors for hepatitis C. Sexually transmitted infections (STIs)  Get screened for STIs, including gonorrhea and chlamydia, if: ? You are sexually active and are younger than 30 years of age. ?  You are older than 30 years of age and your health care provider tells you that you are at risk for this type of infection. ? Your sexual activity has changed since you were last screened, and you are at increased risk for chlamydia or gonorrhea. Ask your health care provider if you are at risk.  Ask your health care provider about whether you are at high risk for HIV. Your health care provider may recommend a prescription medicine to help prevent HIV infection. If you choose to take medicine to prevent HIV, you should first get tested for HIV. You should then be tested every 3 months for as long as you are taking the medicine. Pregnancy  If you are about to stop having your period (premenopausal) and you may become pregnant, seek counseling before you get pregnant.  Take 400 to 800 micrograms (mcg) of folic acid every day if you become  pregnant.  Ask for birth control (contraception) if you want to prevent pregnancy. Osteoporosis and menopause Osteoporosis is a disease in which the bones lose minerals and strength with aging. This can result in bone fractures. If you are 55 years old or older, or if you are at risk for osteoporosis and fractures, ask your health care provider if you should:  Be screened for bone loss.  Take a calcium or vitamin D supplement to lower your risk of fractures.  Be given hormone replacement therapy (HRT) to treat symptoms of menopause. Follow these instructions at home: Lifestyle  Do not use any products that contain nicotine or tobacco, such as cigarettes, e-cigarettes, and chewing tobacco. If you need help quitting, ask your health care provider.  Do not use street drugs.  Do not share needles.  Ask your health care provider for help if you need support or information about quitting drugs. Alcohol use  Do not drink alcohol if: ? Your health care provider tells you not to drink. ? You are pregnant, may be pregnant, or are planning to become pregnant.  If you drink alcohol: ? Limit how much you use to 0-1 drink a day. ? Limit intake if you are breastfeeding.  Be aware of how much alcohol is in your drink. In the [place] one drink equals one 12 oz bottle of beer (355 mL), one 5 oz glass of wine (148 mL), or one 1 oz glass of hard liquor (44 mL). General instructions  Schedule regular health, dental, and eye exams.  Stay current with your vaccines.  Tell your health care provider if: ? You often feel depressed. ? You have ever been abused or do not feel safe at home. Summary  Adopting a healthy lifestyle and getting preventive care are important in promoting health and wellness.  Follow your health care provider's instructions about healthy diet, exercising, and getting tested or screened for diseases.  Follow your health care provider's instructions on monitoring your  cholesterol and blood pressure. This information is not intended to replace advice given to you by your health care provider. Make sure you discuss any questions you have with your health care provider. Document Revised: 10/26/2018 Document Reviewed: 10/26/2018 Elsevier Patient Education  2021 [employer].

## 2021-03-19 NOTE — Addendum Note (Signed)
Addended by: Hulan Fray on: 03/19/2021 04:12 PM   Modules accepted: Orders

## 2021-03-19 NOTE — Progress Notes (Signed)
New Patient Office Visit  Subjective:  Patient ID: Sara Harrison, female    DOB: April 20, 1991  Age: 30 y.o. MRN: [id number]  CC:  Chief Complaint  Patient presents with  . New Patient (Initial Visit)    Pt last PCP was [facility]. Pt would like to check Thyroid due to family history and recent fatigue.    HPI Sara Harrison presents for new care she has history of positive ana antibody screen in past.  She has fatigue that has been for around 4- 5 to months She denies any worsening symptoms, is able to carry on with her day just tired.  She has no heavy bleeding, with regular periods. Had history of iron deficiency anemia in past with pregnancies, not on iron now.   She is married with 3 children all vaginal births with all children and no complications. She does not use contraceptives her husband had a vasectomy after third child.   Patient  denies any fever, body aches,chills, rash, chest pain, shortness of breath, nausea, vomiting, or diarrhea.  Denies dizziness, lightheadedness, pre syncopal or syncopal episodes.   Taking vitamin D supplement. She is also having no loss of appetite or weight loss or gain.  OB GYN PAP due was in 2018 and was normal.    Past Medical History:  Diagnosis Date  . Asthma   . Chlamydia   . Dyspareunia, female   . History of precipitous delivery   . Irregular menses   . Migraine   . Postpartum depression   . Seasonal allergies     Past Surgical History:  Procedure Laterality Date  . INTRAUTERINE DEVICE (IUD) INSERTION  06/02/2010  . IUD REMOVAL    . WISDOM TOOTH EXTRACTION  11/16/2020    Family History  Problem Relation Age of Onset  . Heart disease Maternal Grandmother   . Hypertension Maternal Grandfather   . Heart failure Paternal Grandfather   . Thyroid disease Paternal Grandfather   . Thyroid disease Father     Social History   Socioeconomic History  . Marital status: Married    Spouse name: Not on file  . Number of  children: Not on file  . Years of education: Not on file  . Highest education level: Not on file  Occupational History  . Not on file  Tobacco Use  . Smoking status: Former Smoker    Types: E-cigarettes, Cigarettes  . Smokeless tobacco: Never Used  Vaping Use  . Vaping Use: Never used  Substance and Sexual Activity  . Alcohol use: Yes  . Drug use: No  . Sexual activity: Yes    Birth control/protection: None  Other Topics Concern  . Not on file  Social History Narrative  . Not on file   Social Determinants of Health   Financial Resource Strain: Not on file  Food Insecurity: Not on file  Transportation Needs: Not on file  Physical Activity: Not on file  Stress: Not on file  Social Connections: Not on file  Intimate Partner Violence: Not on file    ROS Review of Systems  Constitutional: Positive for fatigue. Negative for activity change, appetite change, chills, diaphoresis, fever and unexpected weight change.  HENT: Negative.   Respiratory: Negative.   Cardiovascular: Negative.   Gastrointestinal: Negative.   Genitourinary: Negative.   Musculoskeletal: Negative.   Neurological: Negative.   Hematological: Negative.   Psychiatric/Behavioral: Negative.     Objective:   Today's Vitals: BP (!) 94/58 (BP Location: Left  Arm, Patient Position: Sitting)   Pulse 87   Temp (!) 97.2 F (36.2 C)   Ht 5' 4.76" (1.645 m)   Wt 145 lb 6.4 oz (66 kg)   LMP 02/17/2021 (Exact Date)   SpO2 98%   BMI 24.37 kg/m   Physical Exam Vitals reviewed.  Constitutional:      General: She is not in acute distress.    Appearance: Normal appearance. She is well-developed. She is not ill-appearing or diaphoretic.     Interventions: She is not intubated.    Comments: Patient appers well, not sickly. Speaking in complete sentences. Patient moves on and off of exam table and in room without difficulty. Gait is normal in hall and in room. Patient is oriented to person place time and situation.  Patient answers questions appropriately and engages eye contact and verbal dialect with provider.     HENT:     Head: Normocephalic and atraumatic.     Comments: Wears glasses. Has had eye exam yearly.     Right Ear: External ear normal.     Left Ear: External ear normal.     Nose: Nose normal.     Mouth/Throat:     Pharynx: No oropharyngeal exudate.  Eyes:     General: Lids are normal. No scleral icterus.       Right eye: No discharge.        Left eye: No discharge.     Conjunctiva/sclera: Conjunctivae normal.     Right eye: Right conjunctiva is not injected. No exudate or hemorrhage.    Left eye: Left conjunctiva is not injected. No exudate or hemorrhage.    Pupils: Pupils are equal, round, and reactive to light.  Neck:     Thyroid: No thyroid mass or thyromegaly.     Vascular: Normal carotid pulses. No carotid bruit, hepatojugular reflux or JVD.     Trachea: Trachea and phonation normal. No tracheal tenderness or tracheal deviation.     Meningeal: Brudzinski's sign and Kernig's sign absent.  Cardiovascular:     Rate and Rhythm: Normal rate and regular rhythm.     Pulses: Normal pulses.          Radial pulses are 2+ on the right side and 2+ on the left side.       Dorsalis pedis pulses are 2+ on the right side and 2+ on the left side.       Posterior tibial pulses are 2+ on the right side and 2+ on the left side.     Heart sounds: Normal heart sounds, S1 normal and S2 normal. Heart sounds not distant. No murmur heard. No friction rub. No gallop.   Pulmonary:     Effort: Pulmonary effort is normal. No tachypnea, bradypnea, accessory muscle usage or respiratory distress. She is not intubated.     Breath sounds: Normal breath sounds. No stridor. No wheezing or rales.  Chest:     Chest wall: No tenderness.  Breasts:     Right: No supraclavicular adenopathy.     Left: No supraclavicular adenopathy.    Abdominal:     General: Bowel sounds are normal. There is no distension or  abdominal bruit.     Palpations: Abdomen is soft. There is no shifting dullness, fluid wave, hepatomegaly, splenomegaly, mass or pulsatile mass.     Tenderness: There is no abdominal tenderness. There is no right CVA tenderness, left CVA tenderness, guarding or rebound.     Hernia: No hernia is present.  Musculoskeletal:        General: No tenderness or deformity. Normal range of motion.     Cervical back: Full passive range of motion without pain, normal range of motion and neck supple. No edema, erythema, rigidity or tenderness. No spinous process tenderness or muscular tenderness. Normal range of motion.     Right lower leg: No edema.     Left lower leg: No edema.  Lymphadenopathy:     Head:     Right side of head: No submental, submandibular, tonsillar, preauricular, posterior auricular or occipital adenopathy.     Left side of head: No submental, submandibular, tonsillar, preauricular, posterior auricular or occipital adenopathy.     Cervical: No cervical adenopathy.     Right cervical: No superficial, deep or posterior cervical adenopathy.    Left cervical: No superficial, deep or posterior cervical adenopathy.     Upper Body:     Right upper body: No supraclavicular or pectoral adenopathy.     Left upper body: No supraclavicular or pectoral adenopathy.  Skin:    General: Skin is warm and dry.     Coloration: Skin is not jaundiced or pale.     Findings: No abrasion, bruising, burn, ecchymosis, erythema, lesion, petechiae or rash.     Nails: There is no clubbing.  Neurological:     Mental Status: She is alert and oriented to person, place, and time.     GCS: GCS eye subscore is 4. GCS verbal subscore is 5. GCS motor subscore is 6.     Cranial Nerves: No cranial nerve deficit.     Sensory: No sensory deficit.     Motor: No tremor, atrophy, abnormal muscle tone or seizure activity.     Coordination: Coordination normal.     Gait: Gait normal.     Deep Tendon Reflexes: Reflexes are  normal and symmetric. Reflexes normal. Babinski sign absent on the right side. Babinski sign absent on the left side.     Reflex Scores:      Tricep reflexes are 2+ on the right side and 2+ on the left side.      Bicep reflexes are 2+ on the right side and 2+ on the left side.      Brachioradialis reflexes are 2+ on the right side and 2+ on the left side.      Patellar reflexes are 2+ on the right side and 2+ on the left side.      Achilles reflexes are 2+ on the right side and 2+ on the left side. Psychiatric:        Speech: Speech normal.        Behavior: Behavior normal.        Thought Content: Thought content normal.        Judgment: Judgment normal.     Assessment & Plan:   Problem List Items Addressed This Visit      Other   Fatigue - Primary   Relevant Orders   CBC with Differential/Platelet   Lipid panel   Comprehensive metabolic panel   V03 and Folate Panel   Thyroid Profile   Vitamin D deficiency   Relevant Orders   VITAMIN D 25 Hydroxy (Vit-D Deficiency, Fractures)   Family history of thyroid disorder   Screening for blood or protein in urine   Relevant Orders   Urinalysis, Routine w reflex microscopic      1. Fatigue, unspecified type Will check labs. She is sleeping well.  - CBC with  Differential/Platelet - Lipid panel - Comprehensive metabolic panel - T61 and Folate Panel - Thyroid Profile  2. Vitamin D deficiency  - VITAMIN D 25 Hydroxy (Vit-D Deficiency, Fractures)  3. Screening for blood or protein in urine  - Urinalysis, Routine w reflex microscopic  4. Family history of thyroid disorder Check labs and will monitor.   PAP is due she will call her OBGYN or schedule here for full physical here.  Red Flags discussed. The patient was given clear instructions to go to ER or return to medical center if any red flags develop, symptoms do not improve, worsen or new problems develop. They verbalized understanding.  Return if symptoms worsen or fail  to improve, for at any time for any worsening symptoms, Go to Emergency room/ urgent care if worse.  Outpatient Encounter Medications as of 03/19/2021  Medication Sig  . ferrous sulfate 325 (65 FE) MG EC tablet Take 325 mg by mouth 3 (three) times daily with meals. (Patient not taking: Reported on 03/19/2021)  . Prenatal Vit-Fe Fumarate-FA (PRENATAL MULTIVITAMIN) TABS tablet Take 1 tablet by mouth daily at 12 noon. (Patient not taking: Reported on 03/19/2021)  . sertraline (ZOLOFT) 50 MG tablet Take 1 tablets daily (Patient not taking: Reported on 03/19/2021)   No facility-administered encounter medications on file as of 03/19/2021.    Follow-up: Return if symptoms worsen or fail to improve, for at any time for any worsening symptoms, Go to Emergency room/ urgent care if worse.   Jairo Ben, FNP

## 2021-03-20 ENCOUNTER — Other Ambulatory Visit: Payer: Self-pay | Admitting: Adult Health

## 2021-03-20 DIAGNOSIS — E559 Vitamin D deficiency, unspecified: Secondary | ICD-10-CM

## 2021-03-20 LAB — URINE CULTURE
MICRO NUMBER:: 11849345
Result:: NO GROWTH
SPECIMEN QUALITY:: ADEQUATE

## 2021-03-20 LAB — THYROID PANEL
Free Thyroxine Index: 1.7 (ref 1.2–4.9)
T3 Uptake Ratio: 25 % (ref 24–39)
T4, Total: 6.6 ug/dL (ref 4.5–12.0)

## 2021-03-20 MED ORDER — VITAMIN D (ERGOCALCIFEROL) 1.25 MG (50000 UNIT) PO CAPS: 50000.0000 [IU] | ORAL_CAPSULE | ORAL | 0 refills | Status: AC

## 2021-03-20 NOTE — Progress Notes (Signed)
Thyroid panel within normal limits.  CBC, CMP, B 12 and Folate, and lipid panel within normal limits.  Vitamin  D is low, this can contribute to poor sleep and fatigue, will send in prescription for Vitamin D at 50,000 units by mouth once every 7 days/(once weekly) for 12 weeks. Advise recheck lab Vitamin D in 1-2 weeks after completing vitamin d prescription. Labs need to be scheduled.  Future vitamin D lab orders placed.   Urine culture still pending at this time.

## 2021-03-20 NOTE — Progress Notes (Signed)
1. Vitamin D deficiency - Vitamin D, Ergocalciferol, (DRISDOL) 1.25 MG (50000 UNIT) CAPS capsule; Take 1 capsule (50,000 Units total) by mouth every 7 (seven) days. (taking one tablet per week) scheduled Vitamin D lab in  1-2 weeks after completing prescription.  Dispense: 12 capsule; Refill: 0 - VITAMIN D 25 Hydroxy (Vit-D Deficiency, Fractures); Future

## 2021-08-21 ENCOUNTER — Encounter: Payer: BLUE CROSS/BLUE SHIELD | Admitting: Adult Health

## 2022-02-09 LAB — FETAL NONSTRESS TEST
